# Patient Record
Sex: Female | Born: 1990 | Race: Black or African American | Hispanic: No | Marital: Single | Smoking: Current some day smoker
Health system: Southern US, Community
[De-identification: ages and names within clinical notes are randomized; demographics above are authoritative.]

## PROBLEM LIST (undated history)

## (undated) DIAGNOSIS — F1010 Alcohol abuse, uncomplicated: Secondary | ICD-10-CM

## (undated) DIAGNOSIS — J45909 Unspecified asthma, uncomplicated: Secondary | ICD-10-CM

## (undated) HISTORY — PX: TONSILLECTOMY: SUR1361

---
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
## 2011-01-13 LAB — DIFFERENTIAL
Eosinophils Absolute: 0.2
Eosinophils Relative: 2
Lymphocytes Relative: 23 — ABNORMAL LOW
Lymphs Abs: 2
Monocytes Absolute: 0.6
Monocytes Relative: 7

## 2011-01-13 LAB — POCT I-STAT, CHEM 8
BUN: 13
Calcium, Ion: 1.26
Chloride: 107
Glucose, Bld: 108 — ABNORMAL HIGH
HCT: 34 — ABNORMAL LOW
Potassium: 3.4 — ABNORMAL LOW

## 2011-01-13 LAB — CBC
HCT: 33.4 — ABNORMAL LOW
Hemoglobin: 11.6 — ABNORMAL LOW
MCV: 73.1 — ABNORMAL LOW
RBC: 4.57
WBC: 8.8

## 2012-08-10 ENCOUNTER — Emergency Department: Payer: Self-pay | Admitting: Emergency Medicine

## 2013-01-21 ENCOUNTER — Emergency Department: Payer: Self-pay | Admitting: Emergency Medicine

## 2013-01-21 LAB — COMPREHENSIVE METABOLIC PANEL
Alkaline Phosphatase: 82 U/L (ref 50–136)
Chloride: 106 mmol/L (ref 98–107)
Co2: 26 mmol/L (ref 21–32)
EGFR (Non-African Amer.): >60
Potassium: 3.4 mmol/L — ABNORMAL LOW (ref 3.5–5.1)
SGOT(AST): 23 U/L (ref 15–37)
Sodium: 138 mmol/L (ref 136–145)
Total Protein: 7.9 g/dL (ref 6.4–8.2)

## 2013-01-21 LAB — CBC
HGB: 12.8 g/dL (ref 12.0–16.0)
MCHC: 34.4 g/dL (ref 32.0–36.0)
RDW: 16 % — ABNORMAL HIGH (ref 11.5–14.5)
WBC: 7.1 10*3/uL (ref 3.6–11.0)

## 2013-01-21 LAB — URINALYSIS, COMPLETE
Ketone: NEGATIVE
Squamous Epithelial: 9
WBC UR: 43 /HPF (ref 0–5)

## 2013-01-21 LAB — PREGNANCY, URINE: Pregnancy Test, Urine: NEGATIVE m[IU]/mL

## 2013-01-23 LAB — URINE CULTURE

## 2013-04-29 ENCOUNTER — Emergency Department: Payer: Self-pay | Admitting: Emergency Medicine

## 2013-07-11 ENCOUNTER — Emergency Department: Payer: Self-pay | Admitting: Emergency Medicine

## 2014-10-25 ENCOUNTER — Ambulatory Visit: Payer: Self-pay

## 2014-11-12 ENCOUNTER — Ambulatory Visit
Admission: RE | Admit: 2014-11-12 | Discharge: 2014-11-12 | Disposition: A | Discharge status: Home / Self Care | Payer: Medicaid Other | Source: Ambulatory Visit | Attending: Obstetrics and Gynecology | Admitting: Obstetrics and Gynecology

## 2014-11-12 VITALS — BP 127/75 | HR 100 | Temp 98.0°F | Resp 18 | Ht 64.5 in | Wt 206.0 lb

## 2014-11-12 DIAGNOSIS — D582 Other hemoglobinopathies: Secondary | ICD-10-CM

## 2014-11-12 HISTORY — DX: Unspecified asthma, uncomplicated: J45.909

## 2014-11-12 NOTE — Progress Notes (Signed)
Referring Provider: [facility]  50 minute consultation  Jordan Stevens was referred to [facility] for genetic counseling to discuss her abnormal hemoglobin studies on prenatal labs.  She is currently [redacted] weeks gestation. This note summarizes the information we discussed.    We obtained a detailed family history and pregnancy history.  This is the first pregnancy for Jordan Stevens and her partner together.  Jordan Stevens has two children from prior relationships, ages 40 and 30, who are in good health.  The patient reported no complications thus far in this pregnancy.  She did report alcohol and cigarette use prior to [redacted] weeks gestation. She has stopped both of these exposures since that time.  The alcohol use was estimated to be every other weekend with several drinks on those occasions.  Alcohol consumption during pregnancy has been associated with a number of birth defects including growth delays, small head size, heart defects, eye anomalies and facial differences as well as learning disabilities and behavioral problems.  The risk of these to occur tends to increase with the amount of alcohol consumed, however, malformations have been seen with as little as two drinks per day.  Because there is no safe amount of alcohol consumption during pregnancy, we suggest patients completely avoid alcohol while they are pregnant.  A level 2 ultrasound and fetal echocardiogram (a detailed ultrasound of the fetal heart after 22 weeks) may help to detect growth delays or birth defects associated with alcohol use.  However, it is important to remember that not all birth defects can be identified prenatally and we cannot determine the extent of any developmental or behavioral differences.  Smoking is also known to be associated with low birth weight, preterm delivery and poor pregnancy outcomes. Jordan Stevens reported no family history of birth defects, developmental delays, recurrent  pregnancy loss or known genetic conditions.  There are reported relatives with hypertension, cardiovascular disease and diabetes, which we reviewed are likely to have both inherited and lifestyle factors.  Jordan Stevens and Jordan Stevens are both of African American ancestry and are not related to each other.  Jordan Stevens is noted to have had a quad screen performed through [facility], though we do not have a copy of those results for review.  If there is a problem with those results, we are happy to discuss them in the future.  The hemoglobin fractionation profile ordered by [facility] revealed an increased amount of hemoglobin F, or fetal hemoglobin.  Typically, an adult will have 94-98% Adult hemoglobin (A), and 0-2% Fetal hemoglobin (F) and 1-3% A2 hemoglobin.  Jordan Stevens was found to have 71.4% A, 26.7% F and 1.9% A2.  Also of note, her MCV and MCH were low, at 77 and 24.6, respectively.  An MCV on file from a couple of years ago was 72.  Elevated hemoglobin F can occur for various reasons.  This may be the result of hereditary persistence of fetal hemoglobin (HPFH), other inherited anemias, pregnancy, and rarely other health conditions.  This is higher than the amount typically expected during pregnancy alone, which is typically up to 5%.  Low MCV can also occur for multiple reasons such as iron deficiency, thalassemia trait (alpha or beta) and other hemoglobinopathies.  Iron studies were already performed on Jordan Stevens and were normal.  Most persons with HPFH have normal MCV.  There are a couple of possible explanations for her lab results.  These include: 1. HPFH 2. HPFH and alpha  thalassemia trait 3. Delta beta thalassemia trait  We reviewed that hemoglobin is a chemical within red blood cells that carries oxygen throughout the body.  Normal adult hemoglobin (A) is made up of 4 subunits, two alpha chains and two beta chains. Fetal hemoglobin (F) is made up of two alpha chains and two gamma  chains.  There can be many different variations in the way a body makes hemoglobin, which are most common among persons of African, Asian or Mediterranean ancestry.  Among the most common variants are sickle cell trait and hemoglobin C trait, which are the result of changes in the beta subunit.    We have two copies of all of our genetic instructions, one that is inherited from our mother and one from our father.  For the beta and gamma subunit, we have one gene from each parent.  For the alpha subunit of hemoglobin, there are two genes from each parent that tell our bodies what to make.  Therefore, we each have four total genes that direct the production of the alpha subunit. For a person to have alpha thalassemia trait, two of the four genes have a change so that they do not function properly.  This results in smaller than usual red blood cells.  No other medical problems are expected to occur.    In HPFH, a person has one copy of the gene for normal beta globin and one gene that makes gamma globin, thus resulting in both A and F hemoglobin being made by their body.  This is not harmful and is not expected to cause any health concerns.  In delta beta thalassemia trait, a person has one normal copy of the gene for beta globin and a gene that results in decreased amount of delta and beta chain production.  Other than a low MCV and hemoglobin F on hemoglobin studies, this trait is expected to be asyptomatic.  Regardless of which of these reasons is the cause for the elevated fetal hemoglobin and low MCV, we do not expect any health consequences for Jordan Stevens.  If she desires clarification of the reason, then additional lab studies can be offered to map the beta (and possibly alpha) globin genes.  The current question is how this may impact the health of the current pregnancy or any future children.    To determine the chance for this pregnancy to have a clinically significant hemoglobin condition, testing  of the father of the baby would be most helpful.  We would recommend a CBC and hemoglobin electrophoresis/fractionation on the father of the baby.  A review of Christopher's past CBC studies showed normal MCV (80-83), which significantly decreases the chance that he is a carrier for alpha or beta thalassemia trait.  We have no record of a hemoglobin electrophoresis on him. Jordan Stevens, dob 04/14/1985)  If Jordan Stevens has only HPFH, then we do not expect clinically significant hemoglobinopathy in the child, as this in combination with other hemoglobin changes usually results in a normal clinical outcome.  If she also has alpha thalassemia trait, then it is unlikely to be concerning for the baby because Jordan Stevens has a normal MCV and because in persons of African ancestry, the two non working copies of the gene are usually (>98% of cases) on different chromosomes, meaning one was passed on from each of their parents.  This also means that when a person passes this on to their child, they usually pass on one chromosome that has one  normal copy and one nonworking copy of the gene.  If both parents are carriers this way (called in trans), then at most the baby will inherited two normal and two nonworking copies and be carrier just like the parents.   However, if Jordan Stevens were to be a carrier for delta beta thalassemia, and Jordan Stevens were found to be carrier for beta thalassemia trait or sickle cell trait, then there would be a 1 in 4 chance for the baby to have either beta thalassemia intermedia or mild sickle cell disease. The only way to further assess this is to draw a hemoglobin study on Christopher followed by gene studies on Jordan Stevens if he was positive.  The patient stated that she plans to bring Jordan Stevens to the [facility] for testing. Please let our clinic know when this is performed and we are happy to follow up with the family on those results.  The patient was encouraged to call with  questions or concerns.  We can be contacted at [phone].  Cherly Anderson, MS, CGC

## 2014-11-12 NOTE — Progress Notes (Signed)
Jordan Wells, MS, CGC performed an integral service incident to the physician's initial service.  I was physically present in the clinical area and was immediately available to render assistance.   Ladon Heney C Naylee Frankowski  

## 2014-12-13 ENCOUNTER — Encounter: Payer: Self-pay | Admitting: Family Medicine

## 2014-12-13 ENCOUNTER — Ambulatory Visit (INDEPENDENT_AMBULATORY_CARE_PROVIDER_SITE_OTHER): Payer: Medicaid Other | Admitting: Family Medicine

## 2014-12-13 VITALS — BP 120/80 | HR 78 | Ht 64.0 in | Wt 207.0 lb

## 2014-12-13 DIAGNOSIS — J4520 Mild intermittent asthma, uncomplicated: Secondary | ICD-10-CM | POA: Diagnosis not present

## 2014-12-13 MED ORDER — ALBUTEROL SULFATE HFA 108 (90 BASE) MCG/ACT IN AERS: 2.0000 | INHALATION_SPRAY | Freq: Four times a day (QID) | RESPIRATORY_TRACT | Status: DC | PRN

## 2014-12-13 NOTE — Progress Notes (Signed)
Name: Jordan Stevens   MRN: [id number]    DOB: [birth year]   Date:12/13/2014       Progress Note  Subjective  Chief Complaint  Chief Complaint  Patient presents with  . Asthma    Asthma There is no chest tightness, cough, difficulty breathing, frequent throat clearing, hemoptysis, hoarse voice, shortness of breath, sputum production or wheezing. This is a recurrent problem. The current episode started more than 1 year ago. The problem occurs every several days. The problem has been gradually improving. Pertinent negatives include no appetite change, chest pain, dyspnea on exertion, ear congestion, ear pain, fever, headaches, heartburn, malaise/fatigue, myalgias, nasal congestion, orthopnea, PND, postnasal drip, rhinorrhea, sneezing, sore throat, sweats, trouble swallowing or weight loss. Her symptoms are aggravated by change in weather and pollen. Her symptoms are alleviated by beta-agonist. She reports moderate improvement on treatment. There are no known risk factors for lung disease. Her past medical history is significant for asthma.    No problem-specific assessment & plan notes found for this encounter.   Past Medical History  Diagnosis Date  . Asthma 24 years old    Past Surgical History  Procedure Laterality Date  . Leg surgery Left 2010  . Tonsillectomy      History reviewed. No pertinent family history.  Social History   Social History  . Marital Status: Single    Spouse Name: N/A  . Number of Children: N/A  . Years of Education: N/A   Occupational History  . Not on file.   Social History Main Topics  . Smoking status: Former Games developer  . Smokeless tobacco: Never Used  . Alcohol Use: No  . Drug Use: No  . Sexual Activity: Yes   Other Topics Concern  . Not on file   Social History Narrative    No Known Allergies   Review of Systems  Constitutional: Negative for fever, chills, weight loss, malaise/fatigue and appetite change.  HENT: Negative for ear  discharge, ear pain, hoarse voice, postnasal drip, rhinorrhea, sneezing, sore throat and trouble swallowing.   Eyes: Negative for blurred vision.  Respiratory: Negative for cough, hemoptysis, sputum production, shortness of breath and wheezing.   Cardiovascular: Negative for chest pain, dyspnea on exertion, palpitations, leg swelling and PND.  Gastrointestinal: Negative for heartburn, nausea, abdominal pain, diarrhea, constipation, blood in stool and melena.  Genitourinary: Negative for dysuria, urgency, frequency and hematuria.  Musculoskeletal: Negative for myalgias, back pain, joint pain and neck pain.  Skin: Negative for rash.  Neurological: Negative for dizziness, tingling, sensory change, focal weakness and headaches.  Endo/Heme/Allergies: Negative for environmental allergies and polydipsia. Does not bruise/bleed easily.  Psychiatric/Behavioral: Negative for depression and suicidal ideas. The patient is not nervous/anxious and does not have insomnia.      Objective  Filed Vitals:   12/13/14 0904  BP: 120/80  Pulse: 78  Height:  (1.626 m)  Weight: 207 lb (93.895 kg)    Physical Exam  Constitutional: She is well-developed, well-nourished, and in no distress. No distress.  HENT:  Head: Normocephalic and atraumatic.  Right Ear: External ear normal.  Left Ear: External ear normal.  Nose: Nose normal.  Mouth/Throat: Oropharynx is clear and moist.  Eyes: Conjunctivae and EOM are normal. Pupils are equal, round, and reactive to light. Right eye exhibits no discharge. Left eye exhibits no discharge.  Neck: Normal range of motion. Neck supple. No JVD present. No thyromegaly present.  Cardiovascular: Normal rate, regular rhythm, normal heart sounds and intact distal pulses.  Exam reveals no gallop and no friction rub.   No murmur heard. Pulmonary/Chest: Effort normal and breath sounds normal.  Abdominal: Soft. Bowel sounds are normal. She exhibits no mass. There is no tenderness.  There is no guarding.  Musculoskeletal: Normal range of motion. She exhibits no edema.  Lymphadenopathy:    She has no cervical adenopathy.  Neurological: She is alert. She has normal reflexes.  Skin: Skin is warm and dry. She is not diaphoretic.  Psychiatric: Mood and affect normal.      Assessment & Plan  Problem List Items Addressed This Visit    None    Visit Diagnoses    Asthma, mild intermittent, uncomplicated    -  Primary    Relevant Medications    albuterol (PROVENTIL HFA;VENTOLIN HFA) 108 (90 BASE) MCG/ACT inhaler         Dr. Hayden [facility]  12/13/2014

## 2015-04-16 ENCOUNTER — Encounter: Payer: Self-pay | Admitting: Family Medicine

## 2015-04-16 ENCOUNTER — Ambulatory Visit: Payer: Medicaid Other | Admitting: Family Medicine

## 2015-04-16 ENCOUNTER — Ambulatory Visit (INDEPENDENT_AMBULATORY_CARE_PROVIDER_SITE_OTHER): Payer: Medicaid Other | Admitting: Family Medicine

## 2015-04-16 VITALS — BP 120/70 | HR 80 | Ht 64.0 in | Wt 196.0 lb

## 2015-04-16 DIAGNOSIS — J0100 Acute maxillary sinusitis, unspecified: Secondary | ICD-10-CM | POA: Diagnosis not present

## 2015-04-16 DIAGNOSIS — J029 Acute pharyngitis, unspecified: Secondary | ICD-10-CM

## 2015-04-16 DIAGNOSIS — J4520 Mild intermittent asthma, uncomplicated: Secondary | ICD-10-CM | POA: Diagnosis not present

## 2015-04-16 MED ORDER — AZITHROMYCIN 250 MG PO TABS: ORAL_TABLET | ORAL | Status: DC

## 2015-04-16 MED ORDER — ALBUTEROL SULFATE HFA 108 (90 BASE) MCG/ACT IN AERS: 2.0000 | INHALATION_SPRAY | Freq: Four times a day (QID) | RESPIRATORY_TRACT | Status: DC | PRN

## 2015-04-16 NOTE — Progress Notes (Signed)
Name: Jordan DoeBria Stevens   MRN: [id number]    DOB: [birth year]   Date:04/16/2015       Progress Note  Subjective  Chief Complaint  Chief Complaint  Patient presents with  . URI    cough with yellow production- lost voice    URI  This is a new problem. The current episode started in the past 7 days. The problem has been waxing and waning. There has been no fever. Associated symptoms include congestion, coughing, ear pain, rhinorrhea, sinus pain, sneezing, a sore throat, swollen glands and wheezing. Pertinent negatives include no abdominal pain, chest pain, diarrhea, dysuria, headaches, joint pain, joint swelling, nausea, neck pain or rash. She has tried acetaminophen and inhaler use for the symptoms. The treatment provided mild relief.    No problem-specific assessment & plan notes found for this encounter.   Past Medical History  Diagnosis Date  . Asthma 24 years old    Past Surgical History  Procedure Laterality Date  . Leg surgery Left 2010  . Tonsillectomy      History reviewed. No pertinent family history.  Social History   Social History  . Marital Status: Single    Spouse Name: N/A  . Number of Children: N/A  . Years of Education: N/A   Occupational History  . Not on file.   Social History Main Topics  . Smoking status: Former Games developermoker  . Smokeless tobacco: Never Used  . Alcohol Use: No  . Drug Use: No  . Sexual Activity: Yes   Other Topics Concern  . Not on file   Social History Narrative    No Known Allergies   Review of Systems  Constitutional: Negative for fever, chills, weight loss and malaise/fatigue.  HENT: Positive for congestion, ear pain, rhinorrhea, sneezing and sore throat. Negative for ear discharge.   Eyes: Negative for blurred vision.  Respiratory: Positive for cough and wheezing. Negative for sputum production and shortness of breath.   Cardiovascular: Negative for chest pain, palpitations and leg swelling.  Gastrointestinal: Negative for  heartburn, nausea, abdominal pain, diarrhea, constipation, blood in stool and melena.  Genitourinary: Negative for dysuria, urgency, frequency and hematuria.  Musculoskeletal: Negative for myalgias, back pain, joint pain and neck pain.  Skin: Negative for rash.  Neurological: Negative for dizziness, tingling, sensory change, focal weakness and headaches.  Endo/Heme/Allergies: Negative for environmental allergies and polydipsia. Does not bruise/bleed easily.  Psychiatric/Behavioral: Negative for depression and suicidal ideas. The patient is not nervous/anxious and does not have insomnia.      Objective  Filed Vitals:   04/16/15 1529  BP: 120/70  Pulse: 80  Height: 5\' 4"  (1.626 m)  Weight: 196 lb (88.905 kg)    Physical Exam  Constitutional: She is well-developed, well-nourished, and in no distress. No distress.  HENT:  Head: Normocephalic and atraumatic.  Right Ear: External ear normal.  Left Ear: External ear normal.  Nose: Nose normal.  Mouth/Throat: Posterior oropharyngeal erythema present.  Eyes: Conjunctivae and EOM are normal. Pupils are equal, round, and reactive to light. Right eye exhibits no discharge. Left eye exhibits no discharge.  Neck: Normal range of motion. Neck supple. No JVD present. No thyromegaly present.  Cardiovascular: Normal rate, regular rhythm, normal heart sounds and intact distal pulses.  Exam reveals no gallop and no friction rub.   No murmur heard. Pulmonary/Chest: Effort normal and breath sounds normal.  Abdominal: Soft. Bowel sounds are normal. She exhibits no mass. There is no tenderness. There is no guarding.  Musculoskeletal:  Normal range of motion. She exhibits no edema.  Lymphadenopathy:    She has no cervical adenopathy.  Neurological: She is alert. She has normal reflexes.  Skin: Skin is warm and dry. She is not diaphoretic.  Psychiatric: Mood and affect normal.      Assessment & Plan  Problem List Items Addressed This Visit     None    Visit Diagnoses    Acute maxillary sinusitis, recurrence not specified    -  Primary    Relevant Medications    azithromycin (ZITHROMAX) 250 MG tablet    Pharyngitis        Relevant Medications    azithromycin (ZITHROMAX) 250 MG tablet    Asthma, mild intermittent, uncomplicated        Relevant Medications    albuterol (PROVENTIL HFA;VENTOLIN HFA) 108 (90 Base) MCG/ACT inhaler         Dr. Hayden [facility]  04/16/2015

## 2015-08-06 ENCOUNTER — Other Ambulatory Visit: Payer: Self-pay

## 2015-08-06 DIAGNOSIS — J309 Allergic rhinitis, unspecified: Secondary | ICD-10-CM

## 2015-08-06 MED ORDER — MONTELUKAST SODIUM 10 MG PO TABS: 10.0000 mg | ORAL_TABLET | Freq: Every day | ORAL | Status: DC

## 2016-01-22 ENCOUNTER — Other Ambulatory Visit: Payer: Self-pay | Admitting: Family Medicine

## 2016-01-22 DIAGNOSIS — J4520 Mild intermittent asthma, uncomplicated: Secondary | ICD-10-CM

## 2016-05-15 ENCOUNTER — Other Ambulatory Visit: Payer: Self-pay | Admitting: Family Medicine

## 2016-05-15 DIAGNOSIS — J4520 Mild intermittent asthma, uncomplicated: Secondary | ICD-10-CM

## 2016-05-21 ENCOUNTER — Ambulatory Visit (INDEPENDENT_AMBULATORY_CARE_PROVIDER_SITE_OTHER): Payer: Medicaid Other | Admitting: Family Medicine

## 2016-05-21 ENCOUNTER — Encounter: Payer: Self-pay | Admitting: Family Medicine

## 2016-05-21 VITALS — BP 120/80 | HR 64 | Ht 64.0 in | Wt 190.0 lb

## 2016-05-21 DIAGNOSIS — J4520 Mild intermittent asthma, uncomplicated: Secondary | ICD-10-CM

## 2016-05-21 MED ORDER — ALBUTEROL SULFATE HFA 108 (90 BASE) MCG/ACT IN AERS: 2.0000 | INHALATION_SPRAY | Freq: Four times a day (QID) | RESPIRATORY_TRACT | 11 refills | Status: DC | PRN

## 2016-05-21 MED ORDER — MONTELUKAST SODIUM 10 MG PO TABS: 10.0000 mg | ORAL_TABLET | Freq: Every day | ORAL | 11 refills | Status: DC

## 2016-05-21 NOTE — Progress Notes (Signed)
Name: Jordan Stevens   MRN: [id number]    DOB: 11-May-1990   Date:05/21/2016       Progress Note  Subjective  Chief Complaint  Chief Complaint  Patient presents with  . Asthma    Asthma  She complains of cough and wheezing. There is no chest tightness, difficulty breathing, frequent throat clearing, hemoptysis, hoarse voice, shortness of breath or sputum production. Primary symptoms comments: When have acute flair:. This is a chronic problem. The current episode started more than 1 year ago. The problem occurs intermittently. The problem has been waxing and waning. The cough is non-productive. Pertinent negatives include no chest pain, dyspnea on exertion, ear congestion, ear pain, fever, headaches, heartburn, malaise/fatigue, myalgias, nasal congestion, orthopnea, PND, postnasal drip, rhinorrhea, sore throat or weight loss. Her symptoms are aggravated by change in weather. Her symptoms are alleviated by beta-agonist. She reports moderate improvement on treatment. Her past medical history is significant for asthma. There is no history of bronchiectasis, bronchitis, COPD, emphysema or pneumonia.    No problem-specific Assessment & Plan notes found for this encounter.   Past Medical History:  Diagnosis Date  . Asthma 26 years old    Past Surgical History:  Procedure Laterality Date  . LEG SURGERY Left 2010  . TONSILLECTOMY      No family history on file.  Social History   Social History  . Marital status: Single    Spouse name: N/A  . Number of children: N/A  . Years of education: N/A   Occupational History  . Not on file.   Social History Main Topics  . Smoking status: Former Games developer  . Smokeless tobacco: Never Used  . Alcohol use No  . Drug use: No  . Sexual activity: Yes   Other Topics Concern  . Not on file   Social History Narrative  . No narrative on file    No Known Allergies   Review of Systems  Constitutional: Negative for chills, fever, malaise/fatigue and  weight loss.  HENT: Negative for ear discharge, ear pain, hoarse voice, postnasal drip, rhinorrhea and sore throat.   Eyes: Negative for blurred vision.  Respiratory: Positive for cough and wheezing. Negative for hemoptysis, sputum production and shortness of breath.   Cardiovascular: Negative for chest pain, dyspnea on exertion, palpitations, leg swelling and PND.  Gastrointestinal: Negative for abdominal pain, blood in stool, constipation, diarrhea, heartburn, melena and nausea.  Genitourinary: Negative for dysuria, frequency, hematuria and urgency.  Musculoskeletal: Negative for back pain, joint pain, myalgias and neck pain.  Skin: Negative for rash.  Neurological: Negative for dizziness, tingling, sensory change, focal weakness and headaches.  Endo/Heme/Allergies: Negative for environmental allergies and polydipsia. Does not bruise/bleed easily.  Psychiatric/Behavioral: Negative for depression and suicidal ideas. The patient is not nervous/anxious and does not have insomnia.      Objective  Vitals:   05/21/16 0839  BP: 120/80  Pulse: 64  Weight: 190 lb (86.2 kg)  Height: 5\' 4"  (1.626 m)    Physical Exam  Constitutional: She is well-developed, well-nourished, and in no distress. No distress.  HENT:  Head: Normocephalic and atraumatic.  Right Ear: External ear normal.  Left Ear: External ear normal.  Nose: Nose normal.  Mouth/Throat: Oropharynx is clear and moist.  Eyes: Conjunctivae and EOM are normal. Pupils are equal, round, and reactive to light. Right eye exhibits no discharge. Left eye exhibits no discharge.  Neck: Normal range of motion. Neck supple. No JVD present. No thyromegaly present.  Cardiovascular: Normal  rate, regular rhythm, normal heart sounds and intact distal pulses.  Exam reveals no gallop and no friction rub.   No murmur heard. Pulmonary/Chest: Effort normal and breath sounds normal. No respiratory distress. She has no wheezes. She has no rales.   Abdominal: Soft. Bowel sounds are normal. She exhibits no distension and no mass. There is no tenderness. There is no rebound and no guarding.  Musculoskeletal: Normal range of motion. She exhibits no edema.  Lymphadenopathy:    She has no cervical adenopathy.  Neurological: She is alert. She has normal reflexes.  Skin: Skin is warm and dry. She is not diaphoretic.  Psychiatric: Mood and affect normal.  Nursing note and vitals reviewed.     Assessment & Plan  Problem List Items Addressed This Visit      Respiratory   Mild intermittent asthma - Primary   Relevant Medications   montelukast (SINGULAIR) 10 MG tablet   albuterol (PROAIR HFA) 108 (90 Base) MCG/ACT inhaler        Dr. Hayden Rasmusseneanna Saloni [facility]abl[facility]  05/21/16

## 2016-08-17 ENCOUNTER — Other Ambulatory Visit: Payer: Self-pay

## 2016-08-17 DIAGNOSIS — J45909 Unspecified asthma, uncomplicated: Secondary | ICD-10-CM

## 2016-08-17 MED ORDER — BECLOMETHASONE DIPROPIONATE 40 MCG/ACT IN AERS: 1.0000 | INHALATION_SPRAY | Freq: Two times a day (BID) | RESPIRATORY_TRACT | 12 refills | Status: DC

## 2016-12-08 ENCOUNTER — Other Ambulatory Visit: Payer: Self-pay

## 2016-12-14 ENCOUNTER — Other Ambulatory Visit: Payer: Self-pay

## 2016-12-14 MED ORDER — BECLOMETHASONE DIPROP HFA 40 MCG/ACT IN AERB: 1.0000 | INHALATION_SPRAY | Freq: Two times a day (BID) | RESPIRATORY_TRACT | 2 refills | Status: DC

## 2016-12-14 NOTE — Telephone Encounter (Signed)
Rx Qvar Redihaler ordered in place of Qvar 40 mcg due to backordered

## 2016-12-15 ENCOUNTER — Telehealth: Payer: Self-pay

## 2016-12-15 NOTE — Telephone Encounter (Signed)
PA initiated for Qvar Redihaler 40 mcg  Ref # [id number] Authorization # [id number]

## 2017-04-02 ENCOUNTER — Ambulatory Visit: Payer: Self-pay | Admitting: Family Medicine

## 2017-04-09 ENCOUNTER — Ambulatory Visit (INDEPENDENT_AMBULATORY_CARE_PROVIDER_SITE_OTHER): Payer: Medicaid Other | Admitting: Family Medicine

## 2017-04-09 ENCOUNTER — Encounter: Payer: Self-pay | Admitting: Family Medicine

## 2017-04-09 VITALS — BP 100/64 | HR 70 | Ht 64.0 in | Wt 212.0 lb

## 2017-04-09 DIAGNOSIS — J4520 Mild intermittent asthma, uncomplicated: Secondary | ICD-10-CM

## 2017-04-09 DIAGNOSIS — Z23 Encounter for immunization: Secondary | ICD-10-CM | POA: Diagnosis not present

## 2017-04-09 MED ORDER — ALBUTEROL SULFATE (2.5 MG/3ML) 0.083% IN NEBU: 2.5000 mg | INHALATION_SOLUTION | Freq: Four times a day (QID) | RESPIRATORY_TRACT | 12 refills | Status: DC | PRN

## 2017-04-09 MED ORDER — ALBUTEROL SULFATE HFA 108 (90 BASE) MCG/ACT IN AERS: 2.0000 | INHALATION_SPRAY | Freq: Four times a day (QID) | RESPIRATORY_TRACT | 11 refills | Status: DC | PRN

## 2017-04-09 MED ORDER — BECLOMETHASONE DIPROP HFA 40 MCG/ACT IN AERB: 1.0000 | INHALATION_SPRAY | Freq: Two times a day (BID) | RESPIRATORY_TRACT | 11 refills | Status: DC

## 2017-04-09 MED ORDER — MONTELUKAST SODIUM 10 MG PO TABS: 10.0000 mg | ORAL_TABLET | Freq: Every day | ORAL | 11 refills | Status: DC

## 2017-04-09 NOTE — Progress Notes (Signed)
Name: Jordan Stevens   MRN: [id number]    DOB: 1990/07/21   Date:04/09/2017       Progress Note  Subjective  Chief Complaint  Chief Complaint  Patient presents with  . COPD    COPD  There is no chest tightness, cough, difficulty breathing, frequent throat clearing, hemoptysis, hoarse voice, shortness of breath, sputum production or wheezing. This is a new problem. The current episode started more than 1 year ago. The problem occurs every several days. The problem has been waxing and waning. Pertinent negatives include no appetite change, chest pain, dyspnea on exertion, ear congestion, ear pain, fever, headaches, heartburn, malaise/fatigue, myalgias, nasal congestion, orthopnea, PND, postnasal drip, rhinorrhea, sneezing, sore throat, sweats, trouble swallowing or weight loss. Her symptoms are aggravated by change in weather. Her symptoms are alleviated by beta-agonist and steroid inhaler. She reports moderate improvement on treatment. Her past medical history is significant for COPD. There is no history of asthma, bronchiectasis, bronchitis, emphysema or pneumonia.    No problem-specific Assessment & Plan notes found for this encounter.   Past Medical History:  Diagnosis Date  . Asthma 26 years old    Past Surgical History:  Procedure Laterality Date  . LEG SURGERY Left 2010  . TONSILLECTOMY      No family history on file.  Social History   Socioeconomic History  . Marital status: Single    Spouse name: Not on file  . Number of children: Not on file  . Years of education: Not on file  . Highest education level: Not on file  Social Needs  . Financial resource strain: Not on file  . Food insecurity - worry: Not on file  . Food insecurity - inability: Not on file  . Transportation needs - medical: Not on file  . Transportation needs - non-medical: Not on file  Occupational History  . Not on file  Tobacco Use  . Smoking status: Former Games developermoker  . Smokeless tobacco: Never Used   Substance and Sexual Activity  . Alcohol use: No  . Drug use: No  . Sexual activity: Yes  Other Topics Concern  . Not on file  Social History Narrative  . Not on file    No Known Allergies  Outpatient Medications Prior to Visit  Medication Sig Dispense Refill  . albuterol (PROAIR HFA) 108 (90 Base) MCG/ACT inhaler Inhale 2 puffs into the lungs every 6 (six) hours as needed for wheezing or shortness of breath. 18 Inhaler 11  . beclomethasone (QVAR REDIHALER) 40 MCG/ACT inhaler Inhale 1 puff into the lungs 2 (two) times daily. 1 Inhaler 2  . montelukast (SINGULAIR) 10 MG tablet Take 1 tablet (10 mg total) by mouth at bedtime. 30 tablet 11  . beclomethasone (QVAR) 40 MCG/ACT inhaler Inhale 1 puff into the lungs 2 (two) times daily. 1 Inhaler 12   No facility-administered medications prior to visit.     Review of Systems  Constitutional: Negative for appetite change, chills, fever, malaise/fatigue and weight loss.  HENT: Negative for ear discharge, ear pain, hoarse voice, postnasal drip, rhinorrhea, sneezing, sore throat and trouble swallowing.   Eyes: Negative for blurred vision.  Respiratory: Negative for cough, hemoptysis, sputum production, shortness of breath and wheezing.   Cardiovascular: Negative for chest pain, dyspnea on exertion, palpitations, leg swelling and PND.  Gastrointestinal: Negative for abdominal pain, blood in stool, constipation, diarrhea, heartburn, melena and nausea.  Genitourinary: Negative for dysuria, frequency, hematuria and urgency.  Musculoskeletal: Negative for back pain, joint pain,  myalgias and neck pain.  Skin: Negative for rash.  Neurological: Negative for dizziness, tingling, sensory change, focal weakness and headaches.  Endo/Heme/Allergies: Negative for environmental allergies and polydipsia. Does not bruise/bleed easily.  Psychiatric/Behavioral: Negative for depression and suicidal ideas. The patient is not nervous/anxious and does not have  insomnia.      Objective  Vitals:   04/09/17 1514  BP: 100/64  Pulse: 70  Weight: 212 lb (96.2 kg)  Height: 5\' 4"  (1.626 m)    Physical Exam  Constitutional: She is well-developed, well-nourished, and in no distress. No distress.  HENT:  Head: Normocephalic and atraumatic.  Right Ear: External ear normal.  Left Ear: External ear normal.  Nose: Nose normal.  Mouth/Throat: Oropharynx is clear and moist.  Eyes: Conjunctivae and EOM are normal. Pupils are equal, round, and reactive to light. Right eye exhibits no discharge. Left eye exhibits no discharge.  Neck: Normal range of motion. Neck supple. No JVD present. No thyromegaly present.  Cardiovascular: Normal rate, regular rhythm, normal heart sounds and intact distal pulses. Exam reveals no gallop and no friction rub.  No murmur heard. Pulmonary/Chest: Effort normal and breath sounds normal. She has no wheezes. She has no rales.  Abdominal: Soft. Bowel sounds are normal. She exhibits no mass. There is no tenderness. There is no guarding.  Musculoskeletal: Normal range of motion. She exhibits no edema.  Lymphadenopathy:    She has no cervical adenopathy.  Neurological: She is alert. She has normal reflexes.  Skin: Skin is warm and dry. She is not diaphoretic.  Psychiatric: Mood and affect normal.  Nursing note and vitals reviewed.     Assessment & Plan  Problem List Items Addressed This Visit      Respiratory   Mild intermittent asthma - Primary   Relevant Medications   montelukast (SINGULAIR) 10 MG tablet   albuterol (PROAIR HFA) 108 (90 Base) MCG/ACT inhaler   beclomethasone (QVAR REDIHALER) 40 MCG/ACT inhaler   albuterol (PROVENTIL) (2.5 MG/3ML) 0.083% nebulizer solution    Other Visit Diagnoses    Influenza vaccine needed       Relevant Orders   Flu Vaccine QUAD 36+ mos IM (Completed)      Meds ordered this encounter  Medications  . montelukast (SINGULAIR) 10 MG tablet    Sig: Take 1 tablet (10 mg total)  by mouth at bedtime.    Dispense:  30 tablet    Refill:  11  . albuterol (PROAIR HFA) 108 (90 Base) MCG/ACT inhaler    Sig: Inhale 2 puffs into the lungs every 6 (six) hours as needed for wheezing or shortness of breath.    Dispense:  18 Inhaler    Refill:  11  . beclomethasone (QVAR REDIHALER) 40 MCG/ACT inhaler    Sig: Inhale 1 puff into the lungs 2 (two) times daily.    Dispense:  1 Inhaler    Refill:  11  . albuterol (PROVENTIL) (2.5 MG/3ML) 0.083% nebulizer solution    Sig: Take 3 mLs (2.5 mg total) by nebulization every 6 (six) hours as needed for wheezing or shortness of breath.    Dispense:  75 mL    Refill:  12      Dr. Hayden Rasmusseneanna Genavie Boettger [facility]  04/09/17

## 2017-04-15 ENCOUNTER — Other Ambulatory Visit: Payer: Self-pay

## 2018-03-27 ENCOUNTER — Other Ambulatory Visit: Payer: Self-pay | Admitting: Family Medicine

## 2018-03-27 DIAGNOSIS — J4520 Mild intermittent asthma, uncomplicated: Secondary | ICD-10-CM

## 2018-04-08 ENCOUNTER — Other Ambulatory Visit: Payer: Self-pay

## 2018-04-08 ENCOUNTER — Ambulatory Visit (INDEPENDENT_AMBULATORY_CARE_PROVIDER_SITE_OTHER): Payer: 59 | Admitting: Family Medicine

## 2018-04-08 ENCOUNTER — Encounter: Payer: Self-pay | Admitting: Family Medicine

## 2018-04-08 VITALS — BP 120/62 | HR 70 | Ht 64.0 in | Wt 213.0 lb

## 2018-04-08 DIAGNOSIS — Z23 Encounter for immunization: Secondary | ICD-10-CM | POA: Diagnosis not present

## 2018-04-08 DIAGNOSIS — J4520 Mild intermittent asthma, uncomplicated: Secondary | ICD-10-CM

## 2018-04-08 DIAGNOSIS — M6740 Ganglion, unspecified site: Secondary | ICD-10-CM

## 2018-04-08 MED ORDER — ALBUTEROL SULFATE HFA 108 (90 BASE) MCG/ACT IN AERS
2.0000 | INHALATION_SPRAY | Freq: Four times a day (QID) | RESPIRATORY_TRACT | 11 refills | Status: DC | PRN
Start: 2018-04-08 — End: 2018-04-08

## 2018-04-08 MED ORDER — ALBUTEROL SULFATE HFA 108 (90 BASE) MCG/ACT IN AERS: 2.0000 | INHALATION_SPRAY | Freq: Four times a day (QID) | RESPIRATORY_TRACT | 11 refills | Status: DC | PRN

## 2018-04-08 MED ORDER — MONTELUKAST SODIUM 10 MG PO TABS: 10.0000 mg | ORAL_TABLET | Freq: Every day | ORAL | 11 refills | Status: DC

## 2018-04-08 NOTE — Progress Notes (Signed)
Date:  04/08/2018   Name:  Jordan Stevens   DOB:  07/23/1990   MRN:  [id number]   Chief Complaint: Asthma and Flu Vaccine  Asthma  She complains of wheezing. There is no chest tightness, cough, difficulty breathing, frequent throat clearing, hemoptysis, hoarse voice, shortness of breath or sputum production. Primary symptoms comments: For maintenance/control. This is a chronic problem. The current episode started more than 1 year ago. The problem occurs intermittently. The problem has been waxing and waning. Pertinent negatives include no appetite change, chest pain, dyspnea on exertion, ear congestion, ear pain, fever, headaches, heartburn, malaise/fatigue, myalgias, nasal congestion, orthopnea, PND, postnasal drip, rhinorrhea, sneezing, sore throat, sweats, trouble swallowing or weight loss. Her symptoms are aggravated by change in weather and pollen. Her symptoms are alleviated by nothing. Her past medical history is significant for asthma.    Review of Systems  Constitutional: Negative.  Negative for appetite change, chills, fatigue, fever, malaise/fatigue, unexpected weight change and weight loss.  HENT: Negative for congestion, ear discharge, ear pain, hoarse voice, postnasal drip, rhinorrhea, sinus pressure, sneezing, sore throat and trouble swallowing.   Eyes: Negative for photophobia, pain, discharge, redness and itching.  Respiratory: Positive for wheezing. Negative for cough, hemoptysis, sputum production, shortness of breath and stridor.   Cardiovascular: Negative for chest pain, dyspnea on exertion and PND.  Gastrointestinal: Negative for abdominal pain, blood in stool, constipation, diarrhea, heartburn, nausea and vomiting.  Endocrine: Negative for cold intolerance, heat intolerance, polydipsia, polyphagia and polyuria.  Genitourinary: Negative for dysuria, flank pain, frequency, hematuria, menstrual problem, pelvic pain, urgency, vaginal bleeding and vaginal discharge.    Musculoskeletal: Negative for arthralgias, back pain and myalgias.  Skin: Negative for rash.  Allergic/Immunologic: Negative for environmental allergies and food allergies.  Neurological: Negative for dizziness, weakness, light-headedness, numbness and headaches.  Hematological: Negative for adenopathy. Does not bruise/bleed easily.  Psychiatric/Behavioral: Negative for dysphoric mood. The patient is not nervous/anxious.     Patient Active Problem List   Diagnosis Date Noted  . Mild intermittent asthma 05/21/2016  . Abnormal hemoglobin (HCC) 11/12/2014    No Known Allergies  Past Surgical History:  Procedure Laterality Date  . LEG SURGERY Left 2010  . TONSILLECTOMY      Social History   Tobacco Use  . Smoking status: Former Games developermoker  . Smokeless tobacco: Never Used  Substance Use Topics  . Alcohol use: No  . Drug use: No     Medication list has been reviewed and updated.  Current Meds  Medication Sig  . albuterol (PROAIR HFA) 108 (90 Base) MCG/ACT inhaler Inhale 2 puffs into the lungs every 6 (six) hours as needed for wheezing or shortness of breath.  Marland Kitchen. albuterol (PROVENTIL) (2.5 MG/3ML) 0.083% nebulizer solution Take 3 mLs (2.5 mg total) by nebulization every 6 (six) hours as needed for wheezing or shortness of breath.  . [DISCONTINUED] albuterol (PROAIR HFA) 108 (90 Base) MCG/ACT inhaler Inhale 2 puffs into the lungs every 6 (six) hours as needed for wheezing or shortness of breath.  . [DISCONTINUED] albuterol (PROAIR HFA) 108 (90 Base) MCG/ACT inhaler Inhale 2 puffs into the lungs every 6 (six) hours as needed for wheezing or shortness of breath.    No flowsheet data found.  Physical Exam Vitals signs and nursing note reviewed.  Constitutional:      General: She is not in acute distress.    Appearance: She is not diaphoretic.  HENT:     Head: Normocephalic and atraumatic.  Right Ear: External ear normal.     Left Ear: External ear normal.     Nose: Nose  normal.  Eyes:     General:        Right eye: No discharge.        Left eye: No discharge.     Conjunctiva/sclera: Conjunctivae normal.     Pupils: Pupils are equal, round, and reactive to light.  Neck:     Musculoskeletal: Normal range of motion and neck supple.     Thyroid: No thyromegaly.     Vascular: No JVD.  Cardiovascular:     Rate and Rhythm: Normal rate and regular rhythm.     Heart sounds: Normal heart sounds. No murmur. No friction rub. No gallop.   Pulmonary:     Effort: Pulmonary effort is normal.     Breath sounds: Normal breath sounds.  Abdominal:     General: Bowel sounds are normal.     Palpations: Abdomen is soft. There is no mass.     Tenderness: There is no abdominal tenderness. There is no guarding.  Musculoskeletal: Normal range of motion.     Left wrist: She exhibits deformity.     Comments: Ganglion cyst left dorsum wrist.  Lymphadenopathy:     Cervical: No cervical adenopathy.  Skin:    General: Skin is warm and dry.  Neurological:     Mental Status: She is alert.     Deep Tendon Reflexes: Reflexes are normal and symmetric.     BP 120/62   Pulse 70   Ht 5\' 4"  (1.626 m)   Wt 213 lb (96.6 kg)   LMP 03/20/2018 (Approximate)   Breastfeeding No   BMI 36.56 kg/m   Assessment and Plan:  1. Mild intermittent asthma without complication Episodic.  Mild intermittent.  Currently stable.  Refill albuterol inhaler 2 puffs every 6 hours as needed wheezing and maintenance Singulair 10 mg once a day. - albuterol (PROAIR HFA) 108 (90 Base) MCG/ACT inhaler; Inhale 2 puffs into the lungs every 6 (six) hours as needed for wheezing or shortness of breath.  Dispense: 18 Inhaler; Refill: 11 - montelukast (SINGULAIR) 10 MG tablet; Take 1 tablet (10 mg total) by mouth at bedtime.  Dispense: 30 tablet; Refill: 11  2. Ganglion cyst New problem.  Current ganglion cyst of the left wrist dorsal aspect.  Will refer to orthopedic surgery for evaluation and treatment. -  Ambulatory referral to Orthopedic Surgery  3. Influenza vaccine needed Discussed and administered. - Flu Vaccine QUAD 36+ mos IM

## 2018-05-03 ENCOUNTER — Other Ambulatory Visit: Payer: Self-pay | Admitting: Family Medicine

## 2018-05-03 DIAGNOSIS — J4520 Mild intermittent asthma, uncomplicated: Secondary | ICD-10-CM

## 2018-11-01 ENCOUNTER — Encounter: Payer: Self-pay | Admitting: Family

## 2018-11-01 ENCOUNTER — Telehealth: Payer: 59 | Admitting: Family

## 2018-11-01 DIAGNOSIS — R6889 Other general symptoms and signs: Secondary | ICD-10-CM | POA: Diagnosis not present

## 2018-11-01 DIAGNOSIS — Z20822 Contact with and (suspected) exposure to covid-19: Secondary | ICD-10-CM

## 2018-11-01 NOTE — Progress Notes (Signed)
E-Visit for Corona Virus Screening    Your current symptoms could be consistent with the coronavirus.  Call your health care provider or local health department to request and arrange formal testing. Many health care providers can now test patients at their office but not all are.  Please quarantine yourself while awaiting your test results.  [facility] [phone], [place], [place] or visit [email or web address]   I WILL HAVE SOMEONE CALL AND SCHEDULE TESTING FOR YOU  COVID-19 is a respiratory illness with symptoms that are similar to the flu. Symptoms are typically mild to moderate, but there have been cases of severe illness and death due to the virus. The following symptoms may appear 2-14 days after exposure: . Fever . Cough . Shortness of breath or difficulty breathing . Chills . Repeated shaking with chills . Muscle pain . Headache . Sore throat . New loss of taste or smell . Fatigue . Congestion or runny nose . Nausea or vomiting . Diarrhea  It is vitally important that if you feel that you have an infection such as this virus or any other virus that you stay home and away from places where you may spread it to others.  You should self-quarantine for 14 days if you have symptoms that could potentially be coronavirus or have been in close contact a with a person diagnosed with COVID-19 within the last 2 weeks. You should avoid contact with people age 51 and older.   You should wear a mask or cloth face covering over your nose and mouth if you must be around other people or animals, including pets (even at home). Try to stay at least 6 feet away from other people. This will protect the people around you.  You may also take acetaminophen (Tylenol) as needed for fever.   Reduce your risk of any infection by using the same  precautions used for avoiding the common cold or flu:  Marland Kitchen Wash your hands often with soap and warm water for at least 20 seconds.  If soap and water are not readily available, use an alcohol-based hand sanitizer with at least 60% alcohol.  . If coughing or sneezing, cover your mouth and nose by coughing or sneezing into the elbow areas of your shirt or coat, into a tissue or into your sleeve (not your hands). . Avoid shaking hands with others and consider head nods or verbal greetings only. . Avoid touching your eyes, nose, or mouth with unwashed hands.  . Avoid close contact with people who are sick. . Avoid places or events with large numbers of people in one location, like concerts or sporting events. . Carefully consider travel plans you have or are making. . If you are planning any travel outside or inside the [place], visit the CDC's Travelers' Health webpage for the latest health notices. . If you have some symptoms but not all symptoms, continue to monitor at home and seek medical attention if your symptoms worsen. . If you are having a medical emergency, call 911.  HOME CARE . Only take medications as instructed by your medical team. . Drink plenty of fluids and get plenty of rest. . A steam or ultrasonic humidifier can help if you have congestion.   GET HELP RIGHT AWAY IF YOU HAVE EMERGENCY WARNING SIGNS** FOR COVID-19. If you or someone is showing any of these signs seek emergency medical care immediately. Call 911 or proceed to your closest emergency facility  if: . You develop worsening high fever. . Trouble breathing . Bluish lips or face . Persistent pain or pressure in the chest . New confusion . Inability to wake or stay awake . You cough up blood. . Your symptoms become more severe  **This list is not all possible symptoms. Contact your medical provider for any symptoms that are sever or concerning to you.   MAKE SURE YOU   Understand these instructions.  Will watch your  condition.  Will get help right away if you are not doing well or get worse.  Your e-visit answers were reviewed by a board certified advanced clinical practitioner to complete your personal care plan.  Depending on the condition, your plan could have included both over the counter or prescription medications.  If there is a problem please reply once you have received a response from your provider.  Your safety is important to us.  If you have drug allergies check your prescription carefully.    You can use MyChart to ask questions about today's visit, request a non-urgent call back, or ask for a work or school excuse for 24 hours related to this e-Visit. If it has been greater than 24 hours you will need to follow up with your provider, or enter a new e-Visit to address those concerns. You will get an e-mail in the next two days asking about your experience.  I hope that your e-visit has been valuable and will speed your recovery. Thank you for using e-visits.   Greater than 5 minutes, yet less than 10 minutes of time have been spent researching, coordinating, and implementing care for this patient today.  Thank you for the details you included in the comment boxes. Those details are very helpful in determining the best course of treatment for you and help us to provide the best care.

## 2018-12-07 ENCOUNTER — Telehealth: Payer: 59 | Admitting: Nurse Practitioner

## 2018-12-07 DIAGNOSIS — Z20828 Contact with and (suspected) exposure to other viral communicable diseases: Secondary | ICD-10-CM

## 2018-12-07 DIAGNOSIS — Z20822 Contact with and (suspected) exposure to covid-19: Secondary | ICD-10-CM

## 2018-12-07 MED ORDER — BENZONATATE 100 MG PO CAPS: 100.0000 mg | ORAL_CAPSULE | Freq: Three times a day (TID) | ORAL | 0 refills | Status: DC | PRN

## 2018-12-07 NOTE — Progress Notes (Signed)
E-Visit for Corona Virus Screening   Your current symptoms could be consistent with the coronavirus.  Many health care providers can now test patients at their office but not all are.  [facility] has multiple testing sites. For information on our COVID testing locations and hours go to [email or web address]  Please quarantine yourself while awaiting your test results.  We are enrolling you in our MyChart Home Montioring for COVID19 . Daily you will receive a questionnaire within the MyChart website. Our COVID 19 response team willl be monitoriing your responses daily.  You can go to one of the  testing sites listed below, while they are opened (see hours). You do not need a doctors order to be tested for covid.You do need to self-isolate until your results return and if positive 14 days from when your symptoms started and until you are 3 days symptom free.   Testing Locations (Monday - Friday, 8 a.m. - 3:30 p.m.) . [place]: [facility], [address], [place], [place]  . [place]: [facility], [address], [place], [place] (entrance off [address])  . [place]: [address], [place], [place] (across from [facility])    COVID-19 is a respiratory illness with symptoms that are similar to the flu. Symptoms are typically mild to moderate, but there have been cases of severe illness and death due to the virus. The following symptoms may appear 2-14 days after exposure: . Fever . Cough . Shortness of breath or difficulty breathing . Chills . Repeated shaking with chills . Muscle pain . Headache . Sore throat . New loss of taste or smell . Fatigue . Congestion or runny nose . Nausea or vomiting . Diarrhea  It is vitally important that if you feel that you have an infection such as this virus or any other virus that you stay home and away from places where you may  spread it to others.  You should self-quarantine for 14 days if you have symptoms that could potentially be coronavirus or have been in close contact a with a person diagnosed with COVID-19 within the last 2 weeks. You should avoid contact with people age 65 and older.   You should wear a mask or cloth face covering over your nose and mouth if you must be around other people or animals, including pets (even at home). Try to stay at least 6 feet away from other people. This will protect the people around you.  You can use medication such as A prescription cough medication called Tessalon Perles 100 mg. You may take 1-2 capsules every 8 hours as needed for cough  You may also take acetaminophen (Tylenol) as needed for fever.   Reduce your risk of any infection by using the same precautions used for avoiding the common cold or flu:  . Wash your hands often with soap and warm water for at least 20 seconds.  If soap and water are not readily available, use an alcohol-based hand sanitizer with at least 60% alcohol.  . If coughing or sneezing, cover your mouth and nose by coughing or sneezing into the elbow areas of your shirt or coat, into a tissue or into your sleeve (not your hands). . Avoid shaking hands with others and consider head nods or verbal greetings only. . Avoid touching your eyes, nose, or mouth with unwashed hands.  . Avoid close contact with people who are sick. . Avoid places or   events with large numbers of people in one location, like concerts or sporting events. . Carefully consider travel plans you have or are making. . If you are planning any travel outside or inside the [place], visit the CDC's Travelers' Health webpage for the latest health notices. . If you have some symptoms but not all symptoms, continue to monitor at home and seek medical attention if your symptoms worsen. . If you are having a medical emergency, call 911.  HOME CARE . Only take medications as instructed by your  medical team. . Drink plenty of fluids and get plenty of rest. . A steam or ultrasonic humidifier can help if you have congestion.   GET HELP RIGHT AWAY IF YOU HAVE EMERGENCY WARNING SIGNS** FOR COVID-19. If you or someone is showing any of these signs seek emergency medical care immediately. Call 911 or proceed to your closest emergency facility if: . You develop worsening high fever. . Trouble breathing . Bluish lips or face . Persistent pain or pressure in the chest . New confusion . Inability to wake or stay awake . You cough up blood. . Your symptoms become more severe  **This list is not all possible symptoms. Contact your medical provider for any symptoms that are sever or concerning to you.   MAKE SURE YOU   Understand these instructions.  Will watch your condition.  Will get help right away if you are not doing well or get worse.  Your e-visit answers were reviewed by a board certified advanced clinical practitioner to complete your personal care plan.  Depending on the condition, your plan could have included both over the counter or prescription medications.  If there is a problem please reply once you have received a response from your provider.  Your safety is important to us.  If you have drug allergies check your prescription carefully.    You can use MyChart to ask questions about today's visit, request a non-urgent call back, or ask for a work or school excuse for 24 hours related to this e-Visit. If it has been greater than 24 hours you will need to follow up with your provider, or enter a new e-Visit to address those concerns. You will get an e-mail in the next two days asking about your experience.  I hope that your e-visit has been valuable and will speed your recovery. Thank you for using e-visits.   5-10 minutes spent reviewing and documenting in chart.  

## 2018-12-21 ENCOUNTER — Ambulatory Visit: Payer: 59 | Admitting: Family Medicine

## 2018-12-21 ENCOUNTER — Encounter: Payer: Self-pay | Admitting: Family Medicine

## 2018-12-21 ENCOUNTER — Other Ambulatory Visit: Payer: Self-pay

## 2018-12-21 VITALS — BP 122/82 | HR 84 | Resp 16 | Ht 64.0 in | Wt 227.0 lb

## 2018-12-21 DIAGNOSIS — J4521 Mild intermittent asthma with (acute) exacerbation: Secondary | ICD-10-CM

## 2018-12-21 DIAGNOSIS — J45909 Unspecified asthma, uncomplicated: Secondary | ICD-10-CM

## 2018-12-21 MED ORDER — AZITHROMYCIN 250 MG PO TABS: ORAL_TABLET | ORAL | 0 refills | Status: DC

## 2018-12-21 MED ORDER — GUAIFENESIN-CODEINE 100-10 MG/5ML PO SYRP: 5.0000 mL | ORAL_SOLUTION | Freq: Four times a day (QID) | ORAL | 0 refills | Status: DC | PRN

## 2018-12-21 MED ORDER — PREDNISONE 10 MG PO TABS: 10.0000 mg | ORAL_TABLET | Freq: Every day | ORAL | 0 refills | Status: DC

## 2018-12-21 NOTE — Progress Notes (Signed)
Date:  12/21/2018   Name:  Jordan Stevens   DOB:  04/14/1991   MRN:  [id number]   Chief Complaint: Cough (2 weeks of cough)  Cough This is a new problem. The current episode started 1 to 4 weeks ago (2 weeks). The problem has been waxing and waning. The problem occurs constantly. The cough is productive of purulent sputum (yellow to green). Associated symptoms include a rash, rhinorrhea, shortness of breath and wheezing. Pertinent negatives include no chest pain, chills, ear congestion, ear pain, eye redness, fever, headaches, heartburn, hemoptysis, myalgias, nasal congestion, postnasal drip, sore throat, sweats or weight loss. The symptoms are aggravated by pollens. She has tried a beta-agonist inhaler (tessalon perles) for the symptoms. There is no history of environmental allergies.    Review of Systems  Constitutional: Negative.  Negative for chills, fatigue, fever, unexpected weight change and weight loss.  HENT: Positive for rhinorrhea. Negative for congestion, ear discharge, ear pain, postnasal drip, sinus pressure, sneezing and sore throat.   Eyes: Negative for photophobia, pain, discharge, redness and itching.  Respiratory: Positive for cough, shortness of breath and wheezing. Negative for hemoptysis and stridor.   Cardiovascular: Negative for chest pain.  Gastrointestinal: Negative for abdominal pain, blood in stool, constipation, diarrhea, heartburn, nausea and vomiting.  Endocrine: Negative for cold intolerance, heat intolerance, polydipsia, polyphagia and polyuria.  Genitourinary: Negative for dysuria, flank pain, frequency, hematuria, menstrual problem, pelvic pain, urgency, vaginal bleeding and vaginal discharge.  Musculoskeletal: Negative for arthralgias, back pain and myalgias.  Skin: Positive for rash.  Allergic/Immunologic: Negative for environmental allergies and food allergies.  Neurological: Negative for dizziness, weakness, light-headedness, numbness and headaches.   Hematological: Negative for adenopathy. Does not bruise/bleed easily.  Psychiatric/Behavioral: Negative for dysphoric mood. The patient is not nervous/anxious.     Patient Active Problem List   Diagnosis Date Noted  . Mild intermittent asthma 05/21/2016  . Abnormal hemoglobin (HCC) 11/12/2014    No Known Allergies  Past Surgical History:  Procedure Laterality Date  . LEG SURGERY Left 2010  . TONSILLECTOMY      Social History   Tobacco Use  . Smoking status: Former Games developermoker  . Smokeless tobacco: Never Used  Substance Use Topics  . Alcohol use: No  . Drug use: No     Medication list has been reviewed and updated.  Current Meds  Medication Sig  . albuterol (PROVENTIL HFA) 108 (90 Base) MCG/ACT inhaler Inhale into the lungs every 6 (six) hours as needed for wheezing or shortness of breath.  Marland Kitchen. albuterol (PROVENTIL) (2.5 MG/3ML) 0.083% nebulizer solution TAKE 3 MLS BY NEBULIZATION EVERY 6 HOURS AS NEEDED FOR WHEEZING OR SHORTNESS OF BREATH.  . [DISCONTINUED] albuterol (PROAIR HFA) 108 (90 Base) MCG/ACT inhaler Inhale 2 puffs into the lungs every 6 (six) hours as needed for wheezing or shortness of breath.  . [DISCONTINUED] montelukast (SINGULAIR) 10 MG tablet Take 1 tablet (10 mg total) by mouth at bedtime.    PHQ 2/9 Scores 12/21/2018  PHQ - 2 Score 0    BP Readings from Last 3 Encounters:  12/21/18 122/82  04/08/18 120/62  04/09/17 100/64    Physical Exam Vitals signs and nursing note reviewed.  Constitutional:      General: She is not in acute distress.    Appearance: She is obese. She is not diaphoretic.  HENT:     Head: Normocephalic and atraumatic.     Right Ear: Tympanic membrane, ear canal and external ear normal.  Left Ear: Tympanic membrane, ear canal and external ear normal.     Nose: Nose normal.     Mouth/Throat:     Mouth: Mucous membranes are moist.  Eyes:     General:        Right eye: No discharge.        Left eye: No discharge.      Conjunctiva/sclera: Conjunctivae normal.     Pupils: Pupils are equal, round, and reactive to light.  Neck:     Musculoskeletal: Normal range of motion and neck supple. No muscular tenderness.     Thyroid: No thyromegaly.     Vascular: No carotid bruit or JVD.  Cardiovascular:     Rate and Rhythm: Normal rate and regular rhythm.     Heart sounds: Normal heart sounds. No murmur. No friction rub. No gallop.   Pulmonary:     Effort: Pulmonary effort is normal.     Breath sounds: Wheezing present. No rhonchi or rales.  Abdominal:     General: Bowel sounds are normal.     Palpations: Abdomen is soft. There is no mass.     Tenderness: There is no abdominal tenderness. There is no guarding.  Musculoskeletal: Normal range of motion.  Lymphadenopathy:     Cervical: No cervical adenopathy.  Skin:    General: Skin is warm and dry.     Capillary Refill: Capillary refill takes less than 2 seconds.  Neurological:     General: No focal deficit present.     Mental Status: She is alert.     Deep Tendon Reflexes: Reflexes are normal and symmetric.     Wt Readings from Last 3 Encounters:  12/21/18 227 lb (103 kg)  04/08/18 213 lb (96.6 kg)  04/09/17 212 lb (96.2 kg)    BP 122/82   Pulse 84   Resp 16   Ht 5\' 4"  (1.626 m)   Wt 227 lb (103 kg)   LMP 12/14/2018   SpO2 98%   BMI 38.96 kg/m   Assessment and Plan:  1. Mild intermittent asthma with acute exacerbation in adult Patient with history of mild intermittent asthma but there is an acute exacerbation that is been continuing for over 2 weeks.  Patient has been using rescue inhaler on a every 6 hours basis patient was given a sample of Symbicort twice a day to use with rescue inhaler. - albuterol (PROVENTIL HFA) 108 (90 Base) MCG/ACT inhaler; Inhale into the lungs every 6 (six) hours as needed for wheezing or shortness of breath. - predniSONE (DELTASONE) 10 MG tablet; Take 1 tablet (10 mg total) by mouth daily with breakfast.  Dispense:  30 tablet; Refill: 0  2. Asthma with bronchitis Patient has had a purulent cough with her asthma for over 2 weeks therefore we will initiate antibiotic treatment plan.  Patient was also given prednisone 10 mg once a day and azithromycin 2 today followed by 1 a day for 4 days.  Patient was given Robitussin-AC 1 teaspoon every 6 hours as needed cough - albuterol (PROVENTIL HFA) 108 (90 Base) MCG/ACT inhaler; Inhale into the lungs every 6 (six) hours as needed for wheezing or shortness of breath. - predniSONE (DELTASONE) 10 MG tablet; Take 1 tablet (10 mg total) by mouth daily with breakfast.  Dispense: 30 tablet; Refill: 0 - guaiFENesin-codeine (ROBITUSSIN AC) 100-10 MG/5ML syrup; Take 5 mLs by mouth 4 (four) times daily as needed for cough.  Dispense: 118 mL; Refill: 0 - azithromycin (ZITHROMAX) 250 MG tablet;  2 today then 1 a day for 4 days  Dispense: 6 tablet; Refill: 0

## 2019-01-13 ENCOUNTER — Other Ambulatory Visit: Payer: Self-pay | Admitting: Family Medicine

## 2019-01-13 DIAGNOSIS — J45909 Unspecified asthma, uncomplicated: Secondary | ICD-10-CM

## 2019-01-13 DIAGNOSIS — J4521 Mild intermittent asthma with (acute) exacerbation: Secondary | ICD-10-CM

## 2019-05-11 ENCOUNTER — Other Ambulatory Visit: Payer: Self-pay | Admitting: Family Medicine

## 2019-05-11 DIAGNOSIS — J45909 Unspecified asthma, uncomplicated: Secondary | ICD-10-CM

## 2019-05-11 DIAGNOSIS — J4521 Mild intermittent asthma with (acute) exacerbation: Secondary | ICD-10-CM

## 2019-06-06 ENCOUNTER — Ambulatory Visit: Payer: 59

## 2019-08-03 ENCOUNTER — Ambulatory Visit: Payer: 59 | Admitting: Family Medicine

## 2019-08-03 ENCOUNTER — Encounter: Payer: Self-pay | Admitting: Family Medicine

## 2019-08-03 ENCOUNTER — Other Ambulatory Visit: Payer: Self-pay

## 2019-08-03 DIAGNOSIS — J4520 Mild intermittent asthma, uncomplicated: Secondary | ICD-10-CM | POA: Diagnosis not present

## 2019-08-03 DIAGNOSIS — J4521 Mild intermittent asthma with (acute) exacerbation: Secondary | ICD-10-CM | POA: Diagnosis not present

## 2019-08-03 DIAGNOSIS — J45909 Unspecified asthma, uncomplicated: Secondary | ICD-10-CM | POA: Diagnosis not present

## 2019-08-03 MED ORDER — MONTELUKAST SODIUM 10 MG PO TABS: 10.0000 mg | ORAL_TABLET | Freq: Every day | ORAL | 3 refills | Status: DC

## 2019-08-03 MED ORDER — ALBUTEROL SULFATE (2.5 MG/3ML) 0.083% IN NEBU: INHALATION_SOLUTION | RESPIRATORY_TRACT | 11 refills | Status: AC

## 2019-08-03 MED ORDER — ALBUTEROL SULFATE HFA 108 (90 BASE) MCG/ACT IN AERS: INHALATION_SPRAY | RESPIRATORY_TRACT | 1 refills | Status: DC

## 2019-08-03 NOTE — Progress Notes (Signed)
Date:  08/03/2019   Name:  Jordan Stevens   DOB:  [birth year]   MRN:  [id number]   Chief Complaint: Asthma (med refill) and Allergies  Asthma There is no chest tightness, cough, difficulty breathing, frequent throat clearing, hemoptysis, hoarse voice, shortness of breath, sputum production or wheezing. This is a chronic problem. The current episode started more than 1 year ago. The problem occurs intermittently. The problem has been gradually improving. Pertinent negatives include no appetite change, chest pain, dyspnea on exertion, ear congestion, ear pain, fever, headaches, heartburn, malaise/fatigue, myalgias, nasal congestion, orthopnea, PND, postnasal drip, rhinorrhea, sneezing, sore throat, sweats, trouble swallowing or weight loss. Her symptoms are aggravated by URI. Her symptoms are alleviated by beta-agonist. She reports moderate improvement on treatment. Her symptoms are not alleviated by beta-agonist. Her past medical history is significant for asthma. There is no history of bronchiectasis, bronchitis, COPD, emphysema or pneumonia.    Lab Results  Component Value Date   CREATININE 0.77 01/21/2013   BUN 10 01/21/2013   NA 138 01/21/2013   K 3.4 (L) 01/21/2013   CL 106 01/21/2013   CO2 26 01/21/2013   No results found for: CHOL, HDL, LDLCALC, LDLDIRECT, TRIG, CHOLHDL No results found for: TSH No results found for: HGBA1C Lab Results  Component Value Date   WBC 7.1 01/21/2013   HGB 12.8 01/21/2013   HCT 37.1 01/21/2013   MCV 72 (L) 01/21/2013   PLT 347 01/21/2013   Lab Results  Component Value Date   ALT 22 01/21/2013   AST 23 01/21/2013   ALKPHOS 82 01/21/2013   BILITOT 0.7 01/21/2013     Review of Systems  Constitutional: Negative.  Negative for appetite change, chills, fatigue, fever, malaise/fatigue, unexpected weight change and weight loss.  HENT: Negative for congestion, ear discharge, ear pain, hoarse voice, postnasal drip, rhinorrhea, sinus pressure, sneezing,  sore throat and trouble swallowing.   Eyes: Negative for photophobia, pain, discharge, redness and itching.  Respiratory: Negative for cough, hemoptysis, sputum production, choking, chest tightness, shortness of breath, wheezing and stridor.   Cardiovascular: Negative for chest pain, dyspnea on exertion, palpitations, leg swelling and PND.  Gastrointestinal: Negative for abdominal pain, blood in stool, constipation, diarrhea, heartburn, nausea and vomiting.  Endocrine: Negative for cold intolerance, heat intolerance, polydipsia, polyphagia and polyuria.  Genitourinary: Negative for dysuria, flank pain, frequency, hematuria, menstrual problem, pelvic pain, urgency, vaginal bleeding and vaginal discharge.  Musculoskeletal: Negative for arthralgias, back pain and myalgias.  Skin: Negative for rash.  Allergic/Immunologic: Negative for environmental allergies and food allergies.  Neurological: Negative for dizziness, weakness, light-headedness, numbness and headaches.  Hematological: Negative for adenopathy. Does not bruise/bleed easily.  Psychiatric/Behavioral: Negative for dysphoric mood. The patient is not nervous/anxious.     Patient Active Problem List   Diagnosis Date Noted  . Mild intermittent asthma 05/21/2016  . Abnormal hemoglobin (HCC) 11/12/2014    No Known Allergies  Past Surgical History:  Procedure Laterality Date  . LEG SURGERY Left 2010  . TONSILLECTOMY      Social History   Tobacco Use  . Smoking status: Former Games developer  . Smokeless tobacco: Never Used  Substance Use Topics  . Alcohol use: No  . Drug use: No     Medication list has been reviewed and updated.  Current Meds  Medication Sig  . albuterol (VENTOLIN HFA) 108 (90 Base) MCG/ACT inhaler TAKE 2 PUFFS BY MOUTH EVERY 6 HOURS AS NEEDED FOR WHEEZE OR SHORTNESS OF BREATH  PHQ 2/9 Scores 08/03/2019 12/21/2018  PHQ - 2 Score 0 0  PHQ- 9 Score 0 -    BP Readings from Last 3 Encounters:  08/03/19 120/80    12/21/18 122/82  04/08/18 120/62    Physical Exam Vitals and nursing note reviewed.  Constitutional:      General: She is not in acute distress.    Appearance: She is not diaphoretic.  HENT:     Head: Normocephalic and atraumatic.     Right Ear: Tympanic membrane, ear canal and external ear normal.     Left Ear: Tympanic membrane, ear canal and external ear normal.     Nose: Nose normal. No congestion or rhinorrhea.     Mouth/Throat:     Mouth: Mucous membranes are moist.  Eyes:     General:        Right eye: No discharge.        Left eye: No discharge.     Conjunctiva/sclera: Conjunctivae normal.     Pupils: Pupils are equal, round, and reactive to light.  Neck:     Thyroid: No thyromegaly.     Vascular: No JVD.  Cardiovascular:     Rate and Rhythm: Normal rate and regular rhythm.     Heart sounds: Normal heart sounds. No murmur. No friction rub. No gallop.   Pulmonary:     Effort: Pulmonary effort is normal.     Breath sounds: Normal breath sounds. No wheezing or rhonchi.  Abdominal:     General: Bowel sounds are normal.     Palpations: Abdomen is soft. There is no mass.     Tenderness: There is no abdominal tenderness. There is no guarding.  Musculoskeletal:        General: Normal range of motion.     Cervical back: Normal range of motion and neck supple.  Lymphadenopathy:     Cervical: No cervical adenopathy.  Skin:    General: Skin is warm and dry.     Findings: No erythema.  Neurological:     Mental Status: She is alert.     Deep Tendon Reflexes: Reflexes are normal and symmetric.     Wt Readings from Last 3 Encounters:  08/03/19 201 lb (91.2 kg)  12/21/18 227 lb (103 kg)  04/08/18 213 lb (96.6 kg)    BP 120/80   Pulse 80   Ht 5\' 4"  (1.626 m)   Wt 201 lb (91.2 kg)   LMP 08/03/2019   BMI 34.50 kg/m   Assessment and Plan: 1. Mild intermittent asthma with acute exacerbation in adult Chronic.  Controlled.  Stable.  Patient has intermittent  exacerbation of her asthma which is usually controlled with albuterol inhaler 2 puffs every 6 hours but with occasional need for nebulization with exacerbations. - albuterol (VENTOLIN HFA) 108 (90 Base) MCG/ACT inhaler; TAKE 2 PUFFS BY MOUTH EVERY 6 HOURS AS NEEDED FOR WHEEZE OR SHORTNESS OF BREATH  Dispense: 6.7 g; Refill: 1  2. Asthma with bronchitis Chronic.  Controlled.  Stable.  Has occasional cough associated with URI symptoms.  Chronic. - albuterol (VENTOLIN HFA) 108 (90 Base) MCG/ACT inhaler; TAKE 2 PUFFS BY MOUTH EVERY 6 HOURS AS NEEDED FOR WHEEZE OR SHORTNESS OF BREATH  Dispense: 6.7 g; Refill: 1  3. Mild intermittent asthma without complication .  Controlled.  Intermittent.  Mild in nature.  Continue nebulization as needed flareups and will begin Singulair for prevention of allergies as well as asthmatic concerns. - albuterol (PROVENTIL) (2.5 MG/3ML) 0.083% nebulizer  solution; TAKE 3 MLS BY NEBULIZATION EVERY 6 HOURS AS NEEDED FOR WHEEZING OR SHORTNESS OF BREATH.  Dispense: 75 mL; Refill: 11 - montelukast (SINGULAIR) 10 MG tablet; Take 1 tablet (10 mg total) by mouth at bedtime.  Dispense: 30 tablet; Refill: 3

## 2019-08-05 ENCOUNTER — Telehealth: Payer: 59 | Admitting: Physician Assistant

## 2019-08-05 DIAGNOSIS — J45901 Unspecified asthma with (acute) exacerbation: Secondary | ICD-10-CM | POA: Diagnosis not present

## 2019-08-05 MED ORDER — PREDNISONE 20 MG PO TABS: 40.0000 mg | ORAL_TABLET | Freq: Every day | ORAL | 0 refills | Status: DC

## 2019-08-05 NOTE — Progress Notes (Signed)
Visit for Asthma  Based on what you have shared with me, it looks like you may have a flare up of your asthma.  Asthma is a chronic (ongoing) lung disease which results in airway obstruction, inflammation and hyper-responsiveness.   If you find that your symptoms are not controlled with albuterol and prednisone or you symptoms worsening please go to the emergency room for evaluation.   Asthma symptoms vary from person to person, with common symptoms including nighttime awakening and decreased ability to participate in normal activities as a result of shortness of breath. It is often triggered by changes in weather, changes in the season, changes in air temperature, or inside (home, school, daycare or work) allergens such as animal dander, mold, mildew, woodstoves or cockroaches.   It can also be triggered by hormonal changes, extreme emotion, physical exertion or an upper respiratory tract illness.     It is important to identify the trigger, and then eliminate or avoid the trigger if possible.   If you have been prescribed medications to be taken on a regular basis, it is important to follow the asthma action plan and to follow guidelines to adjust medication in response to increasing symptoms of decreased peak expiratory flow rate  Treatment: I have prescribed: Prednisone 40mg  by mouth per day for 5 - 7 days   I saw that you had a prescription for albuterol already so I did not send in another one  HOME CARE . Only take medications as instructed by your medical team. . Consider wearing a mask or scarf to improve breathing air temperature have been shown to decrease irritation and decrease exacerbations . Get rest. . Taking a steamy shower or using a humidifier may help nasal congestion sand ease sore throat pain. You can place a towel over your head and breathe in the steam from hot  water coming from a faucet. . Using a saline nasal spray works much the same way.  . Cough drops, hare candies and sore throat lozenges may ease your cough.  . Avoid close contacts especially the very you and the elderly . Cover your mouth if you cough or sneeze . Always remember to wash your hands.    GET HELP RIGHT AWAY IF: . You develop worsening symptoms; breathlessness at rest, drowsy, confused or agitated, unable to speak in full sentences . You have coughing fits . You develop a severe headache or visual changes . You develop shortness of breath, difficulty breathing or start having chest pain . Your symptoms persist after you have completed your treatment plan . If your symptoms do not improve within 10 days  MAKE SURE YOU . Understand these instructions. . Will watch your condition. . Will get help right away if you are not doing well or get worse.   Your e-visit answers were reviewed by a board certified advanced clinical practitioner to complete your personal care plan, Depending upon the condition, your plan could have included both over the counter or prescription medications.  Please review your pharmacy choice. Your safety is important to . If you have drug allergies check your prescription carefully. You can use MyChart to ask questions about today's visit, request a non-urgent call back, or ask for a work or school excuse for 24 hours related to this e-Visit. If it has been greater than 24 hours you will need to follow up with your provider, or enter a new e-Visit to address those concerns.  You will get an e-mail in the  next two days asking about your experience. I hope that your e-visit has been valuable and will speed your recovery. Thank you for using e-visits.  Greater than 5 minutes, yet less than 10 minutes of time have been spent researching, coordinating, and implementing care for this patient today

## 2019-08-25 ENCOUNTER — Other Ambulatory Visit: Payer: Self-pay | Admitting: Family Medicine

## 2019-08-25 DIAGNOSIS — J45909 Unspecified asthma, uncomplicated: Secondary | ICD-10-CM

## 2019-08-25 DIAGNOSIS — J4521 Mild intermittent asthma with (acute) exacerbation: Secondary | ICD-10-CM

## 2019-10-10 ENCOUNTER — Other Ambulatory Visit: Payer: Self-pay

## 2019-10-10 ENCOUNTER — Emergency Department
Admission: EM | Admit: 2019-10-10 | Discharge: 2019-10-10 | Disposition: A | Discharge status: Home / Self Care | Payer: 59 | Attending: Emergency Medicine | Admitting: Emergency Medicine

## 2019-10-10 DIAGNOSIS — J45909 Unspecified asthma, uncomplicated: Secondary | ICD-10-CM | POA: Insufficient documentation

## 2019-10-10 DIAGNOSIS — Z79899 Other long term (current) drug therapy: Secondary | ICD-10-CM | POA: Insufficient documentation

## 2019-10-10 DIAGNOSIS — K2900 Acute gastritis without bleeding: Secondary | ICD-10-CM | POA: Insufficient documentation

## 2019-10-10 DIAGNOSIS — Z87891 Personal history of nicotine dependence: Secondary | ICD-10-CM | POA: Insufficient documentation

## 2019-10-10 LAB — URINALYSIS, COMPLETE (UACMP) WITH MICROSCOPIC
Bacteria, UA: NONE SEEN
Bilirubin Urine: NEGATIVE
Glucose, UA: NEGATIVE mg/dL
Ketones, ur: NEGATIVE mg/dL
Leukocytes,Ua: NEGATIVE
Nitrite: NEGATIVE
Protein, ur: 30 mg/dL — AB
Specific Gravity, Urine: 1.005 (ref 1.005–1.030)
pH: 8 (ref 5.0–8.0)

## 2019-10-10 LAB — COMPREHENSIVE METABOLIC PANEL
ALT: 61 U/L — ABNORMAL HIGH (ref 0–44)
AST: 66 U/L — ABNORMAL HIGH (ref 15–41)
Albumin: 4.3 g/dL (ref 3.5–5.0)
Alkaline Phosphatase: 60 U/L (ref 38–126)
Anion gap: 15 (ref 5–15)
BUN: <5 mg/dL — ABNORMAL LOW (ref 6–20)
CO2: 26 mmol/L (ref 22–32)
Calcium: 9.1 mg/dL (ref 8.9–10.3)
Chloride: 100 mmol/L (ref 98–111)
Creatinine, Ser: 0.58 mg/dL (ref 0.44–1.00)
GFR calc Af Amer: >60 mL/min (ref >60)
GFR calc non Af Amer: >60 mL/min (ref >60)
Glucose, Bld: 96 mg/dL (ref 70–99)
Potassium: 3.3 mmol/L — ABNORMAL LOW (ref 3.5–5.1)
Sodium: 141 mmol/L (ref 135–145)
Total Bilirubin: 1.1 mg/dL (ref 0.3–1.2)
Total Protein: 8 g/dL (ref 6.5–8.1)

## 2019-10-10 LAB — CBC
HCT: 37.6 % (ref 36.0–46.0)
Hemoglobin: 13.2 g/dL (ref 12.0–15.0)
MCH: 25.2 pg — ABNORMAL LOW (ref 26.0–34.0)
MCHC: 35.1 g/dL (ref 30.0–36.0)
MCV: 71.8 fL — ABNORMAL LOW (ref 80.0–100.0)
Platelets: 308 10*3/uL (ref 150–400)
RBC: 5.24 MIL/uL — ABNORMAL HIGH (ref 3.87–5.11)
RDW: 15.9 % — ABNORMAL HIGH (ref 11.5–15.5)
WBC: 3.3 10*3/uL — ABNORMAL LOW (ref 4.0–10.5)
nRBC: 0 % (ref 0.0–0.2)

## 2019-10-10 LAB — POCT PREGNANCY, URINE: Preg Test, Ur: NEGATIVE

## 2019-10-10 LAB — LIPASE, BLOOD: Lipase: 42 U/L (ref 11–51)

## 2019-10-10 MED ORDER — SODIUM CHLORIDE 0.9% FLUSH: 3.0000 mL | Freq: Once | INTRAVENOUS | Status: DC

## 2019-10-10 MED ORDER — ONDANSETRON 4 MG PO TBDP: 4.0000 mg | ORAL_TABLET | Freq: Three times a day (TID) | ORAL | 0 refills | Status: DC | PRN

## 2019-10-10 MED ORDER — ONDANSETRON HCL 4 MG/2ML IJ SOLN
4.0000 mg | Freq: Once | INTRAMUSCULAR | Status: AC
  Administered 2019-10-10: 4 mg via INTRAVENOUS
  Filled 2019-10-10: qty 2

## 2019-10-10 MED ORDER — SODIUM CHLORIDE 0.9 % IV SOLN
1000.0000 mL | Freq: Once | INTRAVENOUS | Status: AC
  Administered 2019-10-10: 1000 mL via INTRAVENOUS

## 2019-10-10 NOTE — ED Notes (Addendum)
Pt to room from lobby by wheelchair, pt able to transfer to bed with ease. Pt reports vomiting brown liquid starting yesterday. Pt states she threw up twice in the time she was in the lobby. Denies fever or diarrhea. Pt states she had two shots of tequila but drank heavily on Sunday. Denies daily drinking. Pt in NAD.

## 2019-10-10 NOTE — ED Triage Notes (Signed)
Pt c/o N/v for the past 2-3 days, states she is not able to keep and water or food down. Denies any daily alcohol use. See first nurse note for more details. Pt denies any pain.

## 2019-10-10 NOTE — ED Provider Notes (Signed)
[facility] Emergency Department Provider Note   ____________________________________________    I have reviewed the triage vital signs and the nursing notes.   HISTORY  Chief Complaint Alcohol Problem and Emesis     HPI Jordan Stevens is a 29 y.o. female who presents with complaints of nausea and vomiting. Patient reports heavy alcohol use over the last several days. Felt "hung over" yesterday so drank a little more. Today has been unable to tolerate p.o.'s. Mild epigastric discomfort. No fevers or chills. Has not take anything for this. No history of the same. No history of abdominal surgery. No sick contacts. No recent travel. No diarrhea  Past Medical History:  Diagnosis Date  . Asthma 29 years old    Patient Active Problem List   Diagnosis Date Noted  . Mild intermittent asthma 05/21/2016  . Abnormal hemoglobin (HCC) 11/12/2014    Past Surgical History:  Procedure Laterality Date  . LEG SURGERY Left 2010  . TONSILLECTOMY      Prior to Admission medications   Medication Sig Start Date End Date Taking? Authorizing Provider  albuterol (PROVENTIL) (2.5 MG/3ML) 0.083% nebulizer solution TAKE 3 MLS BY NEBULIZATION EVERY 6 HOURS AS NEEDED FOR WHEEZING OR SHORTNESS OF BREATH. 08/03/19   Duanne Limerick, MD  albuterol (VENTOLIN HFA) 108 (90 Base) MCG/ACT inhaler TAKE 2 PUFFS BY MOUTH EVERY 6 HOURS AS NEEDED FOR WHEEZE OR SHORTNESS OF BREATH 08/25/19   Duanne Limerick, MD  azithromycin (ZITHROMAX) 250 MG tablet 2 today then 1 a day for 4 days Patient not taking: Reported on 08/03/2019 12/21/18   Duanne Limerick, MD  guaiFENesin-codeine (ROBITUSSIN AC) 100-10 MG/5ML syrup Take 5 mLs by mouth 4 (four) times daily as needed for cough. Patient not taking: Reported on 08/03/2019 12/21/18   Duanne Limerick, MD  montelukast (SINGULAIR) 10 MG tablet Take 1 tablet (10 mg total) by mouth at bedtime. 08/03/19   Duanne Limerick, MD  ondansetron (ZOFRAN ODT) 4 MG  disintegrating tablet Take 1 tablet (4 mg total) by mouth every 8 (eight) hours as needed. 10/10/19   Jene Every, MD  predniSONE (DELTASONE) 20 MG tablet Take 2 tablets (40 mg total) by mouth daily with breakfast. 08/05/19   Hedges, Tinnie Gens, PA-C     Allergies Patient has no known allergies.  No family history on file.  Social History Social History   Tobacco Use  . Smoking status: Former Games developer  . Smokeless tobacco: Never Used  Substance Use Topics  . Alcohol use: Yes  . Drug use: No    Review of Systems  Constitutional: No fever/chills Eyes: No visual changes.  ENT: No sore throat. Cardiovascular: Denies chest pain. Respiratory: Denies shortness of breath. Gastrointestinal: As above Genitourinary: Negative for dysuria. Musculoskeletal: Negative for back pain. Skin: Negative for rash. Neurological: Negative for headaches    ____________________________________________   PHYSICAL EXAM:  VITAL SIGNS: ED Triage Vitals [10/10/19 1119]  Enc Vitals Group     BP 133/79     Pulse Rate 94     Resp 16     Temp 98.6 F (37 C)     Temp Source Oral     SpO2 98 %     Weight 93 kg (205 lb)     Height 1.626 m (5\' 4" )     Head Circumference      Peak Flow      Pain Score 0     Pain Loc  Pain Edu?      Excl. in [place]?     Constitutional: Alert and oriented.   Nose: No congestion/rhinnorhea. Mouth/Throat: Mucous membranes are moist.    Cardiovascular: Normal rate, regular rhythm. Grossly normal heart sounds.  Good peripheral circulation. Respiratory: Normal respiratory effort.  No retractions.. Gastrointestinal: Soft and nontender. No distention.  No CVA tenderness.  Musculoskeletal: Warm and well perfused Neurologic:  Normal speech and language. No gross focal neurologic deficits are appreciated.  Skin:  Skin is warm, dry and intact. No rash noted. Psychiatric: Mood and affect are normal. Speech and behavior are  normal.  ____________________________________________   LABS (all labs ordered are listed, but only abnormal results are displayed)  Labs Reviewed  COMPREHENSIVE METABOLIC PANEL - Abnormal; Notable for the following components:      Result Value   Potassium 3.3 (*)    BUN <5 (*)    AST 66 (*)    ALT 61 (*)    All other components within normal limits  CBC - Abnormal; Notable for the following components:   WBC 3.3 (*)    RBC 5.24 (*)    MCV 71.8 (*)    MCH 25.2 (*)    RDW 15.9 (*)    All other components within normal limits  URINALYSIS, COMPLETE (UACMP) WITH MICROSCOPIC - Abnormal; Notable for the following components:   Color, Urine YELLOW (*)    APPearance CLEAR (*)    Hgb urine dipstick MODERATE (*)    Protein, ur 30 (*)    All other components within normal limits  LIPASE, BLOOD  POC URINE PREG, ED  POCT PREGNANCY, URINE   ____________________________________________  EKG  None ____________________________________________  RADIOLOGY  None ____________________________________________   PROCEDURES  Procedure(s) performed: No  Procedures   Critical Care performed: No ____________________________________________   INITIAL IMPRESSION / ASSESSMENT AND PLAN / ED COURSE  Pertinent labs & imaging results that were available during my care of the patient were reviewed by me and considered in my medical decision making (see chart for details).  Patient presents with symptoms as noted above. Differential includes alcohol induced gastritis, PUD, less likely pancreatitis.  Lab work today is overall reassuring, low white blood cell count noted, patient will require follow-up for this. Minimal elevation in AST and ALT likely related to recent alcohol use. Will give IV fluids, IV Zofran. Supportive care.  Patient feeling better after IV fluids and Zofran.  Will Rx ODT Zofran, outpatient follow-up with PCP recommended for lab follow-up     ____________________________________________   FINAL CLINICAL IMPRESSION(S) / ED DIAGNOSES  Final diagnoses:  Acute gastritis without hemorrhage, unspecified gastritis type        Note:  This document was prepared using Dragon voice recognition software and may include unintentional dictation errors.   Lavonia Drafts, MD 10/10/19 1501

## 2019-10-10 NOTE — ED Triage Notes (Signed)
Patient to ED via [facility]. Patient states she got a DWI a few days ago and has been drinking liquor heavily since. Patient states her last drink was 2 days ago and she has been having trouble keeping or food or water down since.

## 2019-10-29 ENCOUNTER — Other Ambulatory Visit: Payer: Self-pay | Admitting: Family Medicine

## 2019-10-29 DIAGNOSIS — J45909 Unspecified asthma, uncomplicated: Secondary | ICD-10-CM

## 2019-10-29 DIAGNOSIS — J4521 Mild intermittent asthma with (acute) exacerbation: Secondary | ICD-10-CM

## 2019-10-29 NOTE — Telephone Encounter (Signed)
Requested Prescriptions  Pending Prescriptions Disp Refills  . albuterol (VENTOLIN HFA) 108 (90 Base) MCG/ACT inhaler [Pharmacy Med Name: ALBUTEROL HFA (PROVENTIL) INH] 6.7 g 1    Sig: TAKE 2 PUFFS BY MOUTH EVERY 6 HOURS AS NEEDED FOR WHEEZE OR SHORTNESS OF BREATH     Pulmonology:  Beta Agonists Failed - 10/29/2019 11:41 AM      Failed - One inhaler should last at least one month. If the patient is requesting refills earlier, contact the patient to check for uncontrolled symptoms.      Passed - Valid encounter within last 12 months    Recent Outpatient Visits          2 months ago Mild intermittent asthma with acute exacerbation in adult   [facility] Duanne Limerick, MD   10 months ago Mild intermittent asthma with acute exacerbation in adult   [facility] Duanne Limerick, MD   1 year ago Mild intermittent asthma without complication   [facility] Duanne Limerick, MD   2 years ago Mild intermittent asthma without complication   [facility] Duanne Limerick, MD   3 years ago Mild intermittent asthma without complication   [facility] Duanne Limerick, MD      Future Appointments            In 6 months Duanne Limerick, MD [facility], [facility]

## 2020-05-10 ENCOUNTER — Ambulatory Visit: Payer: Self-pay | Admitting: Family Medicine

## 2020-05-10 ENCOUNTER — Emergency Department
Admission: EM | Admit: 2020-05-10 | Discharge: 2020-05-11 | Disposition: A | Discharge status: Home / Self Care | Payer: Self-pay | Attending: Emergency Medicine | Admitting: Emergency Medicine

## 2020-05-10 DIAGNOSIS — Z87891 Personal history of nicotine dependence: Secondary | ICD-10-CM | POA: Insufficient documentation

## 2020-05-10 DIAGNOSIS — Z7952 Long term (current) use of systemic steroids: Secondary | ICD-10-CM | POA: Insufficient documentation

## 2020-05-10 DIAGNOSIS — F10920 Alcohol use, unspecified with intoxication, uncomplicated: Secondary | ICD-10-CM

## 2020-05-10 DIAGNOSIS — J4520 Mild intermittent asthma, uncomplicated: Secondary | ICD-10-CM | POA: Insufficient documentation

## 2020-05-10 DIAGNOSIS — F10129 Alcohol abuse with intoxication, unspecified: Secondary | ICD-10-CM | POA: Insufficient documentation

## 2020-05-10 NOTE — ED Triage Notes (Signed)
Patient arrived from home by [facility]. Per EMS patient's father called 911 for patient to come and be evaluated. No other info was given on why patient needed to be evaluated. Patient states she has been drinking a lot. Patient could not give writer the amount just states, "A lot". Patient denies SI/HI/AVH. EDP provider at bedside during assessment.

## 2020-05-11 ENCOUNTER — Other Ambulatory Visit: Payer: Self-pay

## 2020-05-11 LAB — CBG MONITORING, ED: Glucose-Capillary: 95 mg/dL (ref 70–99)

## 2020-05-11 NOTE — ED Notes (Signed)
Patient in on phone trying to find ride home.

## 2020-05-11 NOTE — ED Provider Notes (Addendum)
[facility] Provider Note  ____________________________________________   Event Date/Time   First MD Initiated Contact with Patient 05/10/20 2354     (approximate)  I have reviewed the triage vital signs and the nursing notes.   HISTORY  Chief Complaint Alcohol Intoxication    HPI Jordan [facility] is a 30 y.o. female with history of asthma who presents to the emergency department with alcohol intoxication.  States she drank "a lot" today.  Denies drug use.  No SI, HI, hallucinations.  Her father called 911.  Spoke to patient's father on the phone.  He states that he was concerned that she had a drinking problem and wanted to get her help.        Past Medical History:  Diagnosis Date  . Asthma 30 years old    Patient Active Problem List   Diagnosis Date Noted  . Mild intermittent asthma 05/21/2016  . Abnormal hemoglobin (HCC) 11/12/2014    Past Surgical History:  Procedure Laterality Date  . LEG SURGERY Left 2010  . TONSILLECTOMY      Prior to Admission medications   Medication Sig Start Date End Date Taking? Authorizing Provider  albuterol (PROVENTIL) (2.5 MG/3ML) 0.083% nebulizer solution TAKE 3 MLS BY NEBULIZATION EVERY 6 HOURS AS NEEDED FOR WHEEZING OR SHORTNESS OF BREATH. 08/03/19   Duanne Limerick, MD  albuterol (VENTOLIN HFA) 108 (90 Base) MCG/ACT inhaler TAKE 2 PUFFS BY MOUTH EVERY 6 HOURS AS NEEDED FOR WHEEZE OR SHORTNESS OF BREATH 10/29/19   Duanne Limerick, MD  azithromycin (ZITHROMAX) 250 MG tablet 2 today then 1 a day for 4 days Patient not taking: Reported on 08/03/2019 12/21/18   Duanne Limerick, MD  guaiFENesin-codeine (ROBITUSSIN AC) 100-10 MG/5ML syrup Take 5 mLs by mouth 4 (four) times daily as needed for cough. Patient not taking: Reported on 08/03/2019 12/21/18   Duanne Limerick, MD  montelukast (SINGULAIR) 10 MG tablet Take 1 tablet (10 mg total) by mouth at bedtime. 08/03/19   Duanne Limerick, MD  ondansetron  (ZOFRAN ODT) 4 MG disintegrating tablet Take 1 tablet (4 mg total) by mouth every 8 (eight) hours as needed. 10/10/19   Jene Every, MD  predniSONE (DELTASONE) 20 MG tablet Take 2 tablets (40 mg total) by mouth daily with breakfast. 08/05/19   Hedges, Tinnie Gens, PA-C    Allergies Patient has no known allergies.  History reviewed. No pertinent family history.  Social History Social History   Tobacco Use  . Smoking status: Former Games developer  . Smokeless tobacco: Never Used  Substance Use Topics  . Alcohol use: Yes  . Drug use: No    Review of Systems Constitutional: No fever. Eyes: No visual changes. ENT: No sore throat. Cardiovascular: Denies chest pain. Respiratory: Denies shortness of breath. Gastrointestinal: No nausea, vomiting, diarrhea. Genitourinary: Negative for dysuria. Musculoskeletal: Negative for back pain. Skin: Negative for rash. Neurological: Negative for focal weakness or numbness.  ____________________________________________   PHYSICAL EXAM:  VITAL SIGNS:  Vitals:   05/10/20 2355 05/11/20 0001  BP:  103/67  Pulse:  90  Resp:  18  Temp:  (!) 97.5 F (36.4 C)  TempSrc:  Oral  SpO2: 98% 100%  Weight:  90.7 kg  Height:  5\' 4"  (1.626 m)   CONSTITUTIONAL: Alert and oriented and responds appropriately to questions. Well-appearing; well-nourished HEAD: Normocephalic EYES: Conjunctivae clear, pupils appear equal, EOM appear intact ENT: normal nose; moist mucous membranes NECK: Supple, normal ROM CARD: RRR; S1 and  S2 appreciated; no murmurs, no clicks, no rubs, no gallops RESP: Normal chest excursion without splinting or tachypnea; breath sounds clear and equal bilaterally; no wheezes, no rhonchi, no rales, no hypoxia or respiratory distress, speaking full sentences ABD/GI: Normal bowel sounds; non-distended; soft, non-tender, no rebound, no guarding, no peritoneal signs, no hepatosplenomegaly BACK: The back appears normal EXT: Normal ROM in all joints; no  deformity noted, no edema; no cyanosis SKIN: Normal color for age and race; warm; no rash on exposed skin NEURO: Moves all extremities equally; ambulate with steady gait PSYCH: The patient's mood and manner are appropriate.  Appears intoxicated.  No SI, HI or hallucinations.  ____________________________________________   LABS (all labs ordered are listed, but only abnormal results are displayed)  Labs Reviewed  CBG MONITORING, ED   ____________________________________________  EKG  None ____________________________________________  RADIOLOGY I, Deysy Schabel, personally viewed and evaluated these images (plain radiographs) as part of my medical decision making, as well as reviewing the written report by the radiologist.  ED MD interpretation: None  Official radiology report(s): No results found.  ____________________________________________   PROCEDURES  Procedure(s) performed (including Critical Care):  Procedures  ____________________________________________   INITIAL IMPRESSION / ASSESSMENT AND PLAN / ED COURSE  As part of my medical decision making, I reviewed the following data within the electronic MEDICAL RECORD NUMBER History obtained from family, Nursing notes reviewed and incorporated, Old chart reviewed, Notes from prior ED visits and [place] Controlled Substance Database         Patient here for alcohol intoxication.  She denies any psychiatric concerns.  No signs of alcohol withdrawal.  She states that her father called 911 because "he had a bad experience before so he thinks that I am going to have a bad experience".  Spoke to patient's father, [facility], on the phone.  He states that he would like to get resources for her to help with her drinking.  Discussed with father that I can provide him with outpatient resources.  He is comfortable with this plan.  Family trying to get to the emergency department to pick patient up but may have to wait until the morning  due to bad weather and road conditions.      ____________________________________________   FINAL CLINICAL IMPRESSION(S) / ED DIAGNOSES  Final diagnoses:  Alcoholic intoxication without complication [facility])     ED Discharge Orders    None      *Please note:  Alecea Trego was evaluated in Emergency Department on 05/11/2020 for the symptoms described in the history of present illness. She was evaluated in the context of the global COVID-19 pandemic, which necessitated consideration that the patient might be at risk for infection with the SARS-CoV-2 virus that causes COVID-19. Institutional protocols and algorithms that pertain to the evaluation of patients at risk for COVID-19 are in a state of rapid change based on information released by regulatory bodies including the CDC and federal and state organizations. These policies and algorithms were followed during the patient's care in the ED.  Some ED evaluations and interventions may be delayed as a result of limited staffing during and the pandemic.*   Note:  This document was prepared using Dragon voice recognition software and may include unintentional dictation errors.   Tobi Leinweber, Layla Maw, DO 05/11/20 0052    Iliani Vejar, Layla Maw, DO 05/11/20 0126

## 2020-05-11 NOTE — ED Notes (Signed)
Patient able to walk with no issues during discharge. Discharge paperwork reviewed with patient. Detox info given if patient wanting help with detox. Patient verbally stated she understood paperwork. Patient's father picked up patient. This Clinical research associate walked patient to father's car.

## 2020-09-09 ENCOUNTER — Emergency Department
Admission: EM | Admit: 2020-09-09 | Discharge: 2020-09-09 | Disposition: A | Discharge status: Home / Self Care | Payer: Self-pay | Attending: Emergency Medicine | Admitting: Emergency Medicine

## 2020-09-09 ENCOUNTER — Other Ambulatory Visit: Payer: Self-pay

## 2020-09-09 ENCOUNTER — Encounter: Payer: Self-pay | Admitting: Emergency Medicine

## 2020-09-09 DIAGNOSIS — K2920 Alcoholic gastritis without bleeding: Secondary | ICD-10-CM | POA: Insufficient documentation

## 2020-09-09 DIAGNOSIS — J45909 Unspecified asthma, uncomplicated: Secondary | ICD-10-CM | POA: Insufficient documentation

## 2020-09-09 DIAGNOSIS — R1013 Epigastric pain: Secondary | ICD-10-CM

## 2020-09-09 DIAGNOSIS — B9689 Other specified bacterial agents as the cause of diseases classified elsewhere: Secondary | ICD-10-CM | POA: Insufficient documentation

## 2020-09-09 DIAGNOSIS — N3000 Acute cystitis without hematuria: Secondary | ICD-10-CM | POA: Insufficient documentation

## 2020-09-09 LAB — COMPREHENSIVE METABOLIC PANEL
ALT: 30 U/L (ref 0–44)
AST: 43 U/L — ABNORMAL HIGH (ref 15–41)
Albumin: 4.6 g/dL (ref 3.5–5.0)
Alkaline Phosphatase: 59 U/L (ref 38–126)
Anion gap: 16 — ABNORMAL HIGH (ref 5–15)
BUN: 6 mg/dL (ref 6–20)
CO2: 23 mmol/L (ref 22–32)
Calcium: 9.2 mg/dL (ref 8.9–10.3)
Chloride: 98 mmol/L (ref 98–111)
Creatinine, Ser: 0.73 mg/dL (ref 0.44–1.00)
GFR, Estimated: >60 mL/min (ref >60)
Glucose, Bld: 99 mg/dL (ref 70–99)
Potassium: 3.2 mmol/L — ABNORMAL LOW (ref 3.5–5.1)
Sodium: 137 mmol/L (ref 135–145)
Total Bilirubin: 1.7 mg/dL — ABNORMAL HIGH (ref 0.3–1.2)
Total Protein: 8.2 g/dL — ABNORMAL HIGH (ref 6.5–8.1)

## 2020-09-09 LAB — CBC
HCT: 37.5 % (ref 36.0–46.0)
Hemoglobin: 12.7 g/dL (ref 12.0–15.0)
MCH: 24.1 pg — ABNORMAL LOW (ref 26.0–34.0)
MCHC: 33.9 g/dL (ref 30.0–36.0)
MCV: 71 fL — ABNORMAL LOW (ref 80.0–100.0)
Platelets: 404 10*3/uL — ABNORMAL HIGH (ref 150–400)
RBC: 5.28 MIL/uL — ABNORMAL HIGH (ref 3.87–5.11)
RDW: 17.7 % — ABNORMAL HIGH (ref 11.5–15.5)
WBC: 5.9 10*3/uL (ref 4.0–10.5)
nRBC: 0 % (ref 0.0–0.2)

## 2020-09-09 LAB — URINALYSIS, COMPLETE (UACMP) WITH MICROSCOPIC
Bilirubin Urine: NEGATIVE
Glucose, UA: NEGATIVE mg/dL
Hgb urine dipstick: NEGATIVE
Ketones, ur: 5 mg/dL — AB
Leukocytes,Ua: NEGATIVE
Nitrite: POSITIVE — AB
Protein, ur: 100 mg/dL — AB
Specific Gravity, Urine: 1.015 (ref 1.005–1.030)
pH: 7 (ref 5.0–8.0)

## 2020-09-09 LAB — LIPASE, BLOOD: Lipase: 38 U/L (ref 11–51)

## 2020-09-09 LAB — POC URINE PREG, ED: Preg Test, Ur: NEGATIVE

## 2020-09-09 MED ORDER — CEFDINIR 300 MG PO CAPS
300.0000 mg | ORAL_CAPSULE | Freq: Two times a day (BID) | ORAL | 0 refills | Status: AC
Start: 2020-09-09 — End: 2020-09-14

## 2020-09-09 MED ORDER — FAMOTIDINE 40 MG PO TABS
40.0000 mg | ORAL_TABLET | Freq: Every evening | ORAL | 1 refills | Status: DC
Start: 2020-09-09 — End: 2020-10-16

## 2020-09-09 MED ORDER — ONDANSETRON 4 MG PO TBDP
4.0000 mg | ORAL_TABLET | Freq: Three times a day (TID) | ORAL | 0 refills | Status: DC | PRN
Start: 2020-09-09 — End: 2020-10-16

## 2020-09-09 NOTE — ED Triage Notes (Signed)
Pt c/o generalized abd pain at this time, states "I feel like shit". Pt states went on a drinking binge x 1 week, last drink yesterday, pt states has been unable to tolerate fluids, has been vomiting. Pt A&O x4.
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
## 2020-09-10 NOTE — ED Provider Notes (Signed)
[facility] Emergency Department Provider Note   ____________________________________________   Event Date/Time   First MD Initiated Contact with Patient 09/09/20 2125     (approximate)  I have reviewed the triage vital signs and the nursing notes.   HISTORY  Chief Complaint Abdominal Pain    HPI [facility] is a 30 y.o. female with below stated past medical history as well as recent alcohol abuse who presents for midepigastric abdominal pain that is 9/10 in severity radiates up to the back of her throat and is associated with nausea/vomiting.  Patient states that the symptoms began after approximately 5 days of binge drinking and then quitting.  Patient has not tried any medications for the symptoms.  Patient currently denies any vision changes, tinnitus, difficulty speaking, facial droop, sore throat, chest pain, shortness of breath, diarrhea, dysuria, or weakness/numbness/paresthesias in any extremity         Past Medical History:  Diagnosis Date  . Asthma 30 years old    Patient Active Problem List   Diagnosis Date Noted  . Mild intermittent asthma 05/21/2016  . Abnormal hemoglobin (HCC) 11/12/2014    Past Surgical History:  Procedure Laterality Date  . LEG SURGERY Left 2010  . TONSILLECTOMY      Prior to Admission medications   Medication Sig Start Date End Date Taking? Authorizing Provider  cefdinir (OMNICEF) 300 MG capsule Take 1 capsule (300 mg total) by mouth 2 (two) times daily for 5 days. 09/09/20 09/14/20 Yes Merwyn Katos, MD  famotidine (PEPCID) 40 MG tablet Take 1 tablet (40 mg total) by mouth every evening. 09/09/20 09/09/21 Yes Demontrez Rindfleisch, Clent Jacks, MD  ondansetron (ZOFRAN ODT) 4 MG disintegrating tablet Take 1 tablet (4 mg total) by mouth every 8 (eight) hours as needed for nausea or vomiting. 09/09/20  Yes Kacy Hegna, Clent Jacks, MD  albuterol (PROVENTIL) (2.5 MG/3ML) 0.083% nebulizer solution TAKE 3 MLS BY NEBULIZATION EVERY 6 HOURS AS  NEEDED FOR WHEEZING OR SHORTNESS OF BREATH. 08/03/19   Duanne Limerick, MD  albuterol (VENTOLIN HFA) 108 (90 Base) MCG/ACT inhaler TAKE 2 PUFFS BY MOUTH EVERY 6 HOURS AS NEEDED FOR WHEEZE OR SHORTNESS OF BREATH 10/29/19   Duanne Limerick, MD  azithromycin (ZITHROMAX) 250 MG tablet 2 today then 1 a day for 4 days Patient not taking: Reported on 08/03/2019 12/21/18   Duanne Limerick, MD  guaiFENesin-codeine (ROBITUSSIN AC) 100-10 MG/5ML syrup Take 5 mLs by mouth 4 (four) times daily as needed for cough. Patient not taking: Reported on 08/03/2019 12/21/18   Duanne Limerick, MD  montelukast (SINGULAIR) 10 MG tablet Take 1 tablet (10 mg total) by mouth at bedtime. 08/03/19   Duanne Limerick, MD  ondansetron (ZOFRAN ODT) 4 MG disintegrating tablet Take 1 tablet (4 mg total) by mouth every 8 (eight) hours as needed. 10/10/19   Jene Every, MD  predniSONE (DELTASONE) 20 MG tablet Take 2 tablets (40 mg total) by mouth daily with breakfast. 08/05/19   Hedges, Tinnie Gens, PA-C    Allergies Patient has no known allergies.  History reviewed. No pertinent family history.  Social History Social History   Tobacco Use  . Smoking status: Former Games developer  . Smokeless tobacco: Never Used  Substance Use Topics  . Alcohol use: Yes  . Drug use: No    Review of Systems Constitutional: No fever/chills Eyes: No visual changes. ENT: No sore throat. Cardiovascular: Denies chest pain. Respiratory: Denies shortness of breath. Gastrointestinal: Endorses epigastric abdominal pain.  No nausea, no vomiting.  No diarrhea. Genitourinary: Negative for dysuria. Musculoskeletal: Negative for acute arthralgias Skin: Negative for rash. Neurological: Negative for headaches, weakness/numbness/paresthesias in any extremity Psychiatric: Negative for suicidal ideation/homicidal ideation   ____________________________________________   PHYSICAL EXAM:  VITAL SIGNS: ED Triage Vitals [09/09/20 1636]  Enc Vitals Group     BP (!)  136/102     Pulse Rate (!) 104     Resp 20     Temp 97.6 F (36.4 C)     Temp Source Oral     SpO2 98 %     Weight 190 lb (86.2 kg)     Height 5\' 4"  (1.626 m)     Head Circumference      Peak Flow      Pain Score 6     Pain Loc      Pain Edu?      Excl. in GC?    Constitutional: Alert and oriented. Well appearing and in no acute distress. Eyes: Conjunctivae are normal. PERRL. Head: Atraumatic. Nose: No congestion/rhinnorhea. Mouth/Throat: Mucous membranes are moist. Neck: No stridor Cardiovascular: Grossly normal heart sounds.  Good peripheral circulation. Respiratory: Normal respiratory effort.  No retractions. Gastrointestinal: Soft and mild tenderness palpation in the epigastric region. No distention. Musculoskeletal: No obvious deformities Neurologic:  Normal speech and language. No gross focal neurologic deficits are appreciated. Skin:  Skin is warm and dry. No rash noted. Psychiatric: Mood and affect are normal. Speech and behavior are normal.  ____________________________________________   LABS (all labs ordered are listed, but only abnormal results are displayed)  Labs Reviewed  COMPREHENSIVE METABOLIC PANEL - Abnormal; Notable for the following components:      Result Value   Potassium 3.2 (*)    Total Protein 8.2 (*)    AST 43 (*)    Total Bilirubin 1.7 (*)    Anion gap 16 (*)    All other components within normal limits  CBC - Abnormal; Notable for the following components:   RBC 5.28 (*)    MCV 71.0 (*)    MCH 24.1 (*)    RDW 17.7 (*)    Platelets 404 (*)    All other components within normal limits  URINALYSIS, COMPLETE (UACMP) WITH MICROSCOPIC - Abnormal; Notable for the following components:   Color, Urine YELLOW (*)    APPearance HAZY (*)    Ketones, ur 5 (*)    Protein, ur 100 (*)    Nitrite POSITIVE (*)    Bacteria, UA MANY (*)    All other components within normal limits  LIPASE, BLOOD  POC URINE PREG, ED    ____________________________________________  EKG  ED ECG REPORT I, , the attending physician, personally viewed and interpreted this ECG.  Date: 09/10/2020 EKG Time: 1640 Rate: 92 Rhythm: normal sinus rhythm QRS Axis: normal Intervals: normal ST/T Wave abnormalities: normal Narrative Interpretation: no evidence of acute ischemia   PROCEDURES  Procedure(s) performed (including Critical Care):  .1-3 Lead EKG Interpretation Performed by: 09/12/2020, MD Authorized by: Merwyn Katos, MD     Interpretation: normal     ECG rate:  86   ECG rate assessment: normal     Rhythm: sinus rhythm     Ectopy: none     Conduction: normal       ____________________________________________   INITIAL IMPRESSION / ASSESSMENT AND PLAN / ED COURSE  As part of my medical decision making, I reviewed the following data within the electronic  MEDICAL RECORD NUMBER Nursing notes reviewed and incorporated, Labs reviewed, EKG interpreted, Old chart reviewed, Radiograph reviewed and Notes from prior ED visits reviewed and incorporated        Patients symptoms not typical for emergent causes of abdominal pain such as, but not limited to, appendicitis, abdominal aortic aneurysm, surgical biliary disease, pancreatitis, SBO, mesenteric ischemia, serious intra-abdominal bacterial illness. Presentation also not typical of gynecologic emergencies such as TOA, Ovarian Torsion, PID. Not Ectopic. Doubt atypical ACS. Likely alcoholic gastritis Pt tolerating PO. Disposition: Patient will be discharged with strict return precautions and follow up with primary MD within 12-24 hours for further evaluation. Patient understands that this still may have an early presentation of an emergent medical condition such as appendicitis that will require a recheck.      ____________________________________________   FINAL CLINICAL IMPRESSION(S) / ED DIAGNOSES  Final diagnoses:  Acute alcoholic  gastritis without hemorrhage  Epigastric pain  Acute cystitis without hematuria     ED Discharge Orders         Ordered    cefdinir (OMNICEF) 300 MG capsule  2 times daily        09/09/20 2203    ondansetron (ZOFRAN ODT) 4 MG disintegrating tablet  Every 8 hours PRN        09/09/20 2203    famotidine (PEPCID) 40 MG tablet  Every evening        09/09/20 2203           Note:  This document was prepared using Dragon voice recognition software and may include unintentional dictation errors.   Merwyn Katos, MD 09/10/20 2059

## 2020-10-01 ENCOUNTER — Other Ambulatory Visit: Payer: Self-pay | Admitting: Family Medicine

## 2020-10-01 DIAGNOSIS — J45909 Unspecified asthma, uncomplicated: Secondary | ICD-10-CM

## 2020-10-01 DIAGNOSIS — J4521 Mild intermittent asthma with (acute) exacerbation: Secondary | ICD-10-CM

## 2020-10-01 DIAGNOSIS — J4520 Mild intermittent asthma, uncomplicated: Secondary | ICD-10-CM

## 2020-10-01 NOTE — Telephone Encounter (Signed)
Needs appt not seen in over a year

## 2020-10-01 NOTE — Telephone Encounter (Signed)
Left voicemail to set up a refill appointment.

## 2020-10-01 NOTE — Telephone Encounter (Signed)
Requested medication (s) are due for refill today: yes   Requested medication (s) are on the active medication list: yes   Last refill: 07/29/2020  Future visit scheduled: no  Notes to clinic: overdue for follow up appt Message has been sent to patient to contact office    Requested Prescriptions  Pending Prescriptions Disp Refills   albuterol (PROVENTIL) (2.5 MG/3ML) 0.083% nebulizer solution [Pharmacy Med Name: ALBUTEROL SUL 2.5 MG/3 ML SOLN] 75 mL 11    Sig: TAKE 3 MLS BY NEBULIZATION EVERY 6 HOURS AS NEEDED FOR WHEEZING OR SHORTNESS OF BREATH.      Pulmonology:  Beta Agonists Failed - 10/01/2020  1:51 PM      Failed - One inhaler should last at least one month. If the patient is requesting refills earlier, contact the patient to check for uncontrolled symptoms.      Failed - Valid encounter within last 12 months    Recent Outpatient Visits           1 year ago Mild intermittent asthma with acute exacerbation in adult   [facility] Duanne Limerick, MD   1 year ago Mild intermittent asthma with acute exacerbation in adult   [facility] Duanne Limerick, MD   2 years ago Mild intermittent asthma without complication   [facility] Duanne Limerick, MD   3 years ago Mild intermittent asthma without complication   [facility] Duanne Limerick, MD   4 years ago Mild intermittent asthma without complication   [facility] Duanne Limerick, MD                  albuterol (VENTOLIN HFA) 108 (90 Base) MCG/ACT inhaler [Pharmacy Med Name: ALBUTEROL HFA (PROVENTIL) INH] 6.7 each 1    Sig: TAKE 2 PUFFS BY MOUTH EVERY 6 HOURS AS NEEDED FOR WHEEZE OR SHORTNESS OF BREATH      Pulmonology:  Beta Agonists Failed - 10/01/2020  1:51 PM      Failed - One inhaler should last at least one month. If the patient is requesting refills earlier, contact the patient to check for uncontrolled symptoms.      Failed - Valid encounter within last 12 months     Recent Outpatient Visits           1 year ago Mild intermittent asthma with acute exacerbation in adult   [facility] Duanne Limerick, MD   1 year ago Mild intermittent asthma with acute exacerbation in adult   [facility] Duanne Limerick, MD   2 years ago Mild intermittent asthma without complication   [facility] Duanne Limerick, MD   3 years ago Mild intermittent asthma without complication   [facility] Duanne Limerick, MD   4 years ago Mild intermittent asthma without complication   [facility] Duanne Limerick, MD

## 2020-10-13 ENCOUNTER — Encounter: Payer: Self-pay | Admitting: Emergency Medicine

## 2020-10-13 ENCOUNTER — Emergency Department: Payer: Medicaid Other

## 2020-10-13 ENCOUNTER — Other Ambulatory Visit: Payer: Self-pay

## 2020-10-13 ENCOUNTER — Inpatient Hospital Stay
Admission: EM | Admit: 2020-10-13 | Discharge: 2020-10-16 | DRG: 641 | Disposition: A | Discharge status: Home / Self Care | Payer: Medicaid Other | Attending: Internal Medicine | Admitting: Internal Medicine

## 2020-10-13 ENCOUNTER — Inpatient Hospital Stay: Payer: Medicaid Other

## 2020-10-13 DIAGNOSIS — F10931 Alcohol use, unspecified with withdrawal delirium: Secondary | ICD-10-CM

## 2020-10-13 DIAGNOSIS — E872 Acidosis: Secondary | ICD-10-CM | POA: Diagnosis present

## 2020-10-13 DIAGNOSIS — R7989 Other specified abnormal findings of blood chemistry: Secondary | ICD-10-CM | POA: Diagnosis present

## 2020-10-13 DIAGNOSIS — Z6832 Body mass index (BMI) 32.0-32.9, adult: Secondary | ICD-10-CM

## 2020-10-13 DIAGNOSIS — F10221 Alcohol dependence with intoxication delirium: Secondary | ICD-10-CM | POA: Diagnosis present

## 2020-10-13 DIAGNOSIS — E876 Hypokalemia: Secondary | ICD-10-CM | POA: Diagnosis present

## 2020-10-13 DIAGNOSIS — K760 Fatty (change of) liver, not elsewhere classified: Secondary | ICD-10-CM | POA: Diagnosis present

## 2020-10-13 DIAGNOSIS — F17210 Nicotine dependence, cigarettes, uncomplicated: Secondary | ICD-10-CM | POA: Diagnosis present

## 2020-10-13 DIAGNOSIS — K2920 Alcoholic gastritis without bleeding: Secondary | ICD-10-CM | POA: Diagnosis present

## 2020-10-13 DIAGNOSIS — J4520 Mild intermittent asthma, uncomplicated: Secondary | ICD-10-CM | POA: Diagnosis present

## 2020-10-13 DIAGNOSIS — F10231 Alcohol dependence with withdrawal delirium: Secondary | ICD-10-CM | POA: Diagnosis present

## 2020-10-13 DIAGNOSIS — Z833 Family history of diabetes mellitus: Secondary | ICD-10-CM

## 2020-10-13 DIAGNOSIS — F10121 Alcohol abuse with intoxication delirium: Secondary | ICD-10-CM | POA: Diagnosis present

## 2020-10-13 DIAGNOSIS — E669 Obesity, unspecified: Secondary | ICD-10-CM | POA: Diagnosis present

## 2020-10-13 DIAGNOSIS — Z825 Family history of asthma and other chronic lower respiratory diseases: Secondary | ICD-10-CM | POA: Diagnosis not present

## 2020-10-13 DIAGNOSIS — R109 Unspecified abdominal pain: Secondary | ICD-10-CM

## 2020-10-13 DIAGNOSIS — Z811 Family history of alcohol abuse and dependence: Secondary | ICD-10-CM | POA: Diagnosis not present

## 2020-10-13 DIAGNOSIS — Z20822 Contact with and (suspected) exposure to covid-19: Secondary | ICD-10-CM | POA: Diagnosis present

## 2020-10-13 DIAGNOSIS — E8729 Other acidosis: Secondary | ICD-10-CM | POA: Diagnosis present

## 2020-10-13 HISTORY — DX: Alcohol abuse, uncomplicated: F10.10

## 2020-10-13 LAB — COMPREHENSIVE METABOLIC PANEL
ALT: 72 U/L — ABNORMAL HIGH (ref 0–44)
AST: 68 U/L — ABNORMAL HIGH (ref 15–41)
Albumin: 4.7 g/dL (ref 3.5–5.0)
Alkaline Phosphatase: 60 U/L (ref 38–126)
Anion gap: 23 — ABNORMAL HIGH (ref 5–15)
BUN: 23 mg/dL — ABNORMAL HIGH (ref 6–20)
CO2: 17 mmol/L — ABNORMAL LOW (ref 22–32)
Calcium: 9.4 mg/dL (ref 8.9–10.3)
Chloride: 93 mmol/L — ABNORMAL LOW (ref 98–111)
Creatinine, Ser: 1.11 mg/dL — ABNORMAL HIGH (ref 0.44–1.00)
GFR, Estimated: >60 mL/min (ref >60)
Glucose, Bld: 210 mg/dL — ABNORMAL HIGH (ref 70–99)
Potassium: 3.2 mmol/L — ABNORMAL LOW (ref 3.5–5.1)
Sodium: 133 mmol/L — ABNORMAL LOW (ref 135–145)
Total Bilirubin: 1.8 mg/dL — ABNORMAL HIGH (ref 0.3–1.2)
Total Protein: 9 g/dL — ABNORMAL HIGH (ref 6.5–8.1)

## 2020-10-13 LAB — TSH: TSH: 3.226 u[IU]/mL (ref 0.350–4.500)

## 2020-10-13 LAB — URINALYSIS, COMPLETE (UACMP) WITH MICROSCOPIC
Bacteria, UA: NONE SEEN
Bilirubin Urine: NEGATIVE
Glucose, UA: NEGATIVE mg/dL
Ketones, ur: 80 mg/dL — AB
Leukocytes,Ua: NEGATIVE
Nitrite: NEGATIVE
Protein, ur: 100 mg/dL — AB
Specific Gravity, Urine: 1.029 (ref 1.005–1.030)
pH: 6 (ref 5.0–8.0)

## 2020-10-13 LAB — BLOOD GAS, ARTERIAL
Acid-Base Excess: 0.9 mmol/L (ref 0.0–2.0)
Bicarbonate: 23.6 mmol/L (ref 20.0–28.0)
FIO2: 0.21
O2 Saturation: 97.8 %
Patient temperature: 37
pCO2 arterial: 31 mmHg — ABNORMAL LOW (ref 32.0–48.0)
pH, Arterial: 7.49 — ABNORMAL HIGH (ref 7.350–7.450)
pO2, Arterial: 93 mmHg (ref 83.0–108.0)

## 2020-10-13 LAB — RESP PANEL BY RT-PCR (FLU A&B, COVID) ARPGX2
Influenza A by PCR: NEGATIVE
Influenza B by PCR: NEGATIVE
SARS Coronavirus 2 by RT PCR: NEGATIVE

## 2020-10-13 LAB — CBC
HCT: 40.1 % (ref 36.0–46.0)
Hemoglobin: 14 g/dL (ref 12.0–15.0)
MCH: 24.6 pg — ABNORMAL LOW (ref 26.0–34.0)
MCHC: 34.9 g/dL (ref 30.0–36.0)
MCV: 70.6 fL — ABNORMAL LOW (ref 80.0–100.0)
Platelets: 367 10*3/uL (ref 150–400)
RBC: 5.68 MIL/uL — ABNORMAL HIGH (ref 3.87–5.11)
RDW: 19.1 % — ABNORMAL HIGH (ref 11.5–15.5)
WBC: 5.8 10*3/uL (ref 4.0–10.5)
nRBC: 0 % (ref 0.0–0.2)

## 2020-10-13 LAB — PROTIME-INR
INR: 1.1 (ref 0.8–1.2)
Prothrombin Time: 13.7 seconds (ref 11.4–15.2)

## 2020-10-13 LAB — LIPASE, BLOOD: Lipase: 77 U/L — ABNORMAL HIGH (ref 11–51)

## 2020-10-13 LAB — CK: Total CK: 384 U/L — ABNORMAL HIGH (ref 38–234)

## 2020-10-13 LAB — ETHANOL: Alcohol, Ethyl (B): 47 mg/dL — ABNORMAL HIGH (ref <10)

## 2020-10-13 LAB — HCG, QUANTITATIVE, PREGNANCY: hCG, Beta Chain, Quant, S: <1 m[IU]/mL (ref <5)

## 2020-10-13 LAB — MAGNESIUM: Magnesium: 1.6 mg/dL — ABNORMAL LOW (ref 1.7–2.4)

## 2020-10-13 LAB — LACTIC ACID, PLASMA: Lactic Acid, Venous: 3.6 mmol/L (ref 0.5–1.9)

## 2020-10-13 LAB — BETA-HYDROXYBUTYRIC ACID: Beta-Hydroxybutyric Acid: 1.94 mmol/L — ABNORMAL HIGH (ref 0.05–0.27)

## 2020-10-13 MED ORDER — FAMOTIDINE IN NACL 20-0.9 MG/50ML-% IV SOLN
20.0000 mg | Freq: Once | INTRAVENOUS | Status: AC
  Administered 2020-10-13: 20 mg via INTRAVENOUS
  Filled 2020-10-13: qty 50

## 2020-10-13 MED ORDER — ADULT MULTIVITAMIN W/MINERALS CH
1.0000 | ORAL_TABLET | Freq: Every day | ORAL | Status: DC
  Administered 2020-10-13 – 2020-10-16 (×4): 1 via ORAL
  Filled 2020-10-13 (×4): qty 1

## 2020-10-13 MED ORDER — SENNA 8.6 MG PO TABS
1.0000 | ORAL_TABLET | Freq: Two times a day (BID) | ORAL | Status: DC
  Administered 2020-10-13 – 2020-10-15 (×5): 8.6 mg via ORAL
  Filled 2020-10-13 (×6): qty 1

## 2020-10-13 MED ORDER — DEXTROSE 5 % AND 0.9 % NACL IV BOLUS
1000.0000 mL | Freq: Once | INTRAVENOUS | Status: AC
  Administered 2020-10-13: 1000 mL via INTRAVENOUS
  Filled 2020-10-13: qty 1000

## 2020-10-13 MED ORDER — ENOXAPARIN SODIUM 40 MG/0.4ML IJ SOSY
40.0000 mg | PREFILLED_SYRINGE | INTRAMUSCULAR | Status: DC
  Administered 2020-10-13 – 2020-10-14 (×2): 40 mg via SUBCUTANEOUS
  Filled 2020-10-13 (×2): qty 0.4

## 2020-10-13 MED ORDER — PANTOPRAZOLE SODIUM 40 MG PO TBEC
40.0000 mg | DELAYED_RELEASE_TABLET | Freq: Every day | ORAL | Status: DC
  Administered 2020-10-14 – 2020-10-16 (×3): 40 mg via ORAL
  Filled 2020-10-13 (×3): qty 1

## 2020-10-13 MED ORDER — FOLIC ACID 1 MG PO TABS
1.0000 mg | ORAL_TABLET | Freq: Every day | ORAL | Status: DC
  Administered 2020-10-13 – 2020-10-16 (×4): 1 mg via ORAL
  Filled 2020-10-13 (×4): qty 1

## 2020-10-13 MED ORDER — THIAMINE HCL 100 MG/ML IJ SOLN
Freq: Once | INTRAVENOUS | Status: AC
  Filled 2020-10-13: qty 1000

## 2020-10-13 MED ORDER — LORAZEPAM 2 MG/ML IJ SOLN
0.0000 mg | Freq: Four times a day (QID) | INTRAMUSCULAR | Status: DC
  Administered 2020-10-13 – 2020-10-14 (×5): 2 mg via INTRAVENOUS
  Filled 2020-10-13 (×6): qty 1

## 2020-10-13 MED ORDER — MONTELUKAST SODIUM 10 MG PO TABS
10.0000 mg | ORAL_TABLET | Freq: Every day | ORAL | Status: DC
  Administered 2020-10-14 – 2020-10-15 (×2): 10 mg via ORAL
  Filled 2020-10-13 (×3): qty 1

## 2020-10-13 MED ORDER — LORAZEPAM 1 MG PO TABS: 1.0000 mg | ORAL_TABLET | ORAL | Status: AC | PRN

## 2020-10-13 MED ORDER — TRAMADOL HCL 50 MG PO TABS
50.0000 mg | ORAL_TABLET | Freq: Three times a day (TID) | ORAL | Status: DC | PRN
  Administered 2020-10-13 – 2020-10-15 (×2): 50 mg via ORAL
  Filled 2020-10-13 (×2): qty 1

## 2020-10-13 MED ORDER — POTASSIUM CHLORIDE 10 MEQ/100ML IV SOLN
10.0000 meq | Freq: Once | INTRAVENOUS | Status: AC
  Administered 2020-10-13: 10 meq via INTRAVENOUS
  Filled 2020-10-13: qty 100

## 2020-10-13 MED ORDER — LORAZEPAM 2 MG/ML IJ SOLN
1.0000 mg | Freq: Once | INTRAMUSCULAR | Status: AC
  Administered 2020-10-13: 1 mg via INTRAVENOUS
  Filled 2020-10-13: qty 1

## 2020-10-13 MED ORDER — SODIUM CHLORIDE 0.9 % IV BOLUS
1000.0000 mL | Freq: Once | INTRAVENOUS | Status: AC
  Administered 2020-10-13: 1000 mL via INTRAVENOUS

## 2020-10-13 MED ORDER — PANTOPRAZOLE SODIUM 40 MG IV SOLR
40.0000 mg | INTRAVENOUS | Status: AC
  Administered 2020-10-13: 40 mg via INTRAVENOUS
  Filled 2020-10-13: qty 40

## 2020-10-13 MED ORDER — ALBUTEROL SULFATE (2.5 MG/3ML) 0.083% IN NEBU: 2.5000 mg | INHALATION_SOLUTION | Freq: Four times a day (QID) | RESPIRATORY_TRACT | Status: DC | PRN

## 2020-10-13 MED ORDER — KCL IN DEXTROSE-NACL 20-5-0.9 MEQ/L-%-% IV SOLN
INTRAVENOUS | Status: DC
  Filled 2020-10-13 (×11): qty 1000

## 2020-10-13 MED ORDER — LORAZEPAM 2 MG/ML IJ SOLN: 0.0000 mg | Freq: Two times a day (BID) | INTRAMUSCULAR | Status: DC

## 2020-10-13 MED ORDER — THIAMINE HCL 100 MG/ML IJ SOLN
100.0000 mg | Freq: Once | INTRAMUSCULAR | Status: AC
  Administered 2020-10-13: 100 mg via INTRAVENOUS
  Filled 2020-10-13: qty 2

## 2020-10-13 MED ORDER — MAGNESIUM SULFATE 2 GM/50ML IV SOLN
2.0000 g | Freq: Once | INTRAVENOUS | Status: AC
  Administered 2020-10-13: 2 g via INTRAVENOUS
  Filled 2020-10-13: qty 50

## 2020-10-13 MED ORDER — ONDANSETRON 4 MG PO TBDP: 4.0000 mg | ORAL_TABLET | Freq: Three times a day (TID) | ORAL | Status: DC | PRN

## 2020-10-13 MED ORDER — LORAZEPAM 2 MG/ML IJ SOLN
1.0000 mg | INTRAMUSCULAR | Status: AC | PRN
Start: 2020-10-13 — End: 2020-10-16
  Administered 2020-10-13: 2 mg via INTRAVENOUS
  Administered 2020-10-15: 1 mg via INTRAVENOUS
  Filled 2020-10-13 (×2): qty 1

## 2020-10-13 MED ORDER — ALBUTEROL SULFATE HFA 108 (90 BASE) MCG/ACT IN AERS: 2.0000 | INHALATION_SPRAY | Freq: Four times a day (QID) | RESPIRATORY_TRACT | Status: DC | PRN

## 2020-10-13 MED ORDER — ONDANSETRON HCL 4 MG/2ML IJ SOLN
4.0000 mg | INTRAMUSCULAR | Status: AC
  Administered 2020-10-13: 4 mg via INTRAVENOUS
  Filled 2020-10-13: qty 2

## 2020-10-13 NOTE — BH Assessment (Signed)
Writer attempted to complete TTS Consult, but decided it was best to wait for her labs to result before continuing the assessment. Patient was able to answer majority of the questions but was responding to internal stimuli while writer was in the room. She was able to acknowledge what she was seeing wasn't real. Patient is to be admitted to the medical floor.

## 2020-10-13 NOTE — H&P (Signed)
History and Physical    Karalyn Kadel SWF:[id number] DOB: [birth year] DOA: 10/13/2020  PCP: Duanne Limerick, MD (Confirm with patient/family/NH records and if not entered, this has to be entered at [facility] point of entry) Patient coming from: home  I have personally briefly reviewed patient's old medical records in [facility]  Chief Complaint: abdominal pain, hallucinosis  HPI: Jordan Stevens is a 30 y.o. female with medical history significant of asthma and alcohol abuse with multiple ED visits (10/09/19, 05/10/20, 09/09/20) for alcoholic gastritis. She continues to drink greater that 750 ml daily of spirits. On the day of presentation she was having abdominal pain and also marked hallucinosis. She presents to [facility] ED for evaluation.    ED Course: T 97.5  144/103  HR 122  RR 20. Per EDP report she continued to have hallucinations and delerium. Lab reveals glucose 210 , Chloride 93(l), Co2 17 (l), AG 23, Mg 1.6, CK total 384, Lactic acid 3.6, lipase 77  AST 68, ALT 72, CBC nl. In ED she was given 2L NS bolus, IV thiamin, IV Mg, IV K. Maintenance IVF at 250cc/hr running. Abd U/S pending. Patient also given Zofran and Ativan. TRH called to admit for alcohol related ketoacidosis and alcohol withdrawal management.   Review of Systems: As per HPI otherwise 10 point review of systems negative.    Past Medical History:  Diagnosis Date   Alcohol abuse    multiple episode gastritis, episode intoxication with delerium June '22   Asthma 30 years old    Past Surgical History:  Procedure Laterality Date   LEG SURGERY Left 2010   TONSILLECTOMY      Soc Hx - single mother - 72 year old son. Lives independently with her son. Works as a Sales executive. Denies physical or sexual abuse.   reports that she has quit smoking. She has never used smokeless tobacco. She reports current alcohol use. She reports that she does not use drugs.  No Known Allergies  Family History  Problem Relation Age of Onset    Cancer Mother    Alcoholism Father    Diabetes Maternal Grandmother    COPD Paternal Uncle      Prior to Admission medications   Medication Sig Start Date End Date Taking? Authorizing Provider  albuterol (PROVENTIL) (2.5 MG/3ML) 0.083% nebulizer solution TAKE 3 MLS BY NEBULIZATION EVERY 6 HOURS AS NEEDED FOR WHEEZING OR SHORTNESS OF BREATH. 08/03/19   Duanne Limerick, MD  albuterol (VENTOLIN HFA) 108 (90 Base) MCG/ACT inhaler TAKE 2 PUFFS BY MOUTH EVERY 6 HOURS AS NEEDED FOR WHEEZE OR SHORTNESS OF BREATH 10/29/19   Duanne Limerick, MD  azithromycin (ZITHROMAX) 250 MG tablet 2 today then 1 a day for 4 days Patient not taking: Reported on 08/03/2019 12/21/18   Duanne Limerick, MD  famotidine (PEPCID) 40 MG tablet Take 1 tablet (40 mg total) by mouth every evening. 09/09/20 09/09/21  Merwyn Katos, MD  guaiFENesin-codeine (ROBITUSSIN AC) 100-10 MG/5ML syrup Take 5 mLs by mouth 4 (four) times daily as needed for cough. Patient not taking: Reported on 08/03/2019 12/21/18   Duanne Limerick, MD  montelukast (SINGULAIR) 10 MG tablet Take 1 tablet (10 mg total) by mouth at bedtime. 08/03/19   Duanne Limerick, MD  ondansetron (ZOFRAN ODT) 4 MG disintegrating tablet Take 1 tablet (4 mg total) by mouth every 8 (eight) hours as needed. 10/10/19   Jene Every, MD  ondansetron (ZOFRAN ODT) 4 MG disintegrating tablet Take 1  tablet (4 mg total) by mouth every 8 (eight) hours as needed for nausea or vomiting. 09/09/20   Merwyn Katos, MD  predniSONE (DELTASONE) 20 MG tablet Take 2 tablets (40 mg total) by mouth daily with breakfast. 08/05/19   Eyvonne Mechanic, PA-C    Physical Exam: Vitals:   10/13/20 0820 10/13/20 0830 10/13/20 0936 10/13/20 1006  BP: (!) 144/103 (!) 136/94 140/77 (!) 144/77  Pulse: (!) 122 (!) 121 (!) 120 (!) 120  Resp:  (!) 24  17  Temp:    98.4 F (36.9 C)  TempSrc:    Oral  SpO2:  99%  98%  Weight:      Height:         Vitals:   10/13/20 0820 10/13/20 0830 10/13/20 0936 10/13/20  1006  BP: (!) 144/103 (!) 136/94 140/77 (!) 144/77  Pulse: (!) 122 (!) 121 (!) 120 (!) 120  Resp:  (!) 24  17  Temp:    98.4 F (36.9 C)  TempSrc:    Oral  SpO2:  99%  98%  Weight:      Height:       General: overweight woman who is pleasant but hallucinating. Eyes: PERRL, lids and conjunctivae normal ENMT: Mucous membranes are moist. Posterior pharynx clear of any exudate or lesions.Normal dentition with braces.  Neck: normal, supple, no masses, no thyromegaly Respiratory: clear to auscultation bilaterally, no wheezing, no crackles. Normal respiratory effort. No accessory muscle use.  Cardiovascular: Regular tachycardia, no murmurs / rubs / gallops. No extremity edema. 2+ pedal pulses. No carotid bruits.  Abdomen: obese, no tenderness, no masses palpated. No hepatosplenomegaly. Bowel sounds positive.  Musculoskeletal: no clubbing / cyanosis. No joint deformity upper and lower extremities. Good ROM, no contractures. Normal muscle tone.  Skin: no rashes, lesions, ulcers. No induration Neurologic: CN 2-12 grossly intact. Sensation intact, DTR normal. Strength 5/5 in all 4.  Psychiatric: Hallucinating but cogent, able to answer questions. No threatening hallucinations. oriented x 3. Normal mood.     Labs on Admission: I have personally reviewed following labs and imaging studies  CBC: Recent Labs  Lab 10/13/20 0701  WBC 5.8  HGB 14.0  HCT 40.1  MCV 70.6*  PLT 367   Basic Metabolic Panel: Recent Labs  Lab 10/13/20 0701 10/13/20 0827  NA 133*  --   K 3.2*  --   CL 93*  --   CO2 17*  --   GLUCOSE 210*  --   BUN 23*  --   CREATININE 1.11*  --   CALCIUM 9.4  --   MG  --  1.6*   GFR: Estimated Creatinine Clearance: 79.5 mL/min (A) (by C-G formula based on SCr of 1.11 mg/dL (H)). Liver Function Tests: Recent Labs  Lab 10/13/20 0701  AST 68*  ALT 72*  ALKPHOS 60  BILITOT 1.8*  PROT 9.0*  ALBUMIN 4.7   Recent Labs  Lab 10/13/20 0701  LIPASE 77*   No results  for input(s): AMMONIA in the last 168 hours. Coagulation Profile: Recent Labs  Lab 10/13/20 0935  INR 1.1   Cardiac Enzymes: Recent Labs  Lab 10/13/20 0827  CKTOTAL 384*   BNP (last 3 results) No results for input(s): PROBNP in the last 8760 hours. HbA1C: No results for input(s): HGBA1C in the last 72 hours. CBG: No results for input(s): GLUCAP in the last 168 hours. Lipid Profile: No results for input(s): CHOL, HDL, LDLCALC, TRIG, CHOLHDL, LDLDIRECT in the last 72 hours. Thyroid Function  Tests: Recent Labs    10/13/20 0827  TSH 3.226   Anemia Panel: No results for input(s): VITAMINB12, FOLATE, FERRITIN, TIBC, IRON, RETICCTPCT in the last 72 hours. Urine analysis:    Component Value Date/Time   COLORURINE YELLOW (A) 09/09/2020 1638   APPEARANCEUR HAZY (A) 09/09/2020 1638   APPEARANCEUR Hazy 01/21/2013 1604   LABSPEC 1.015 09/09/2020 1638   LABSPEC 1.027 01/21/2013 1604   PHURINE 7.0 09/09/2020 1638   GLUCOSEU NEGATIVE 09/09/2020 1638   GLUCOSEU Negative 01/21/2013 1604   HGBUR NEGATIVE 09/09/2020 1638   BILIRUBINUR NEGATIVE 09/09/2020 1638   BILIRUBINUR Negative 01/21/2013 1604   KETONESUR 5 (A) 09/09/2020 1638   PROTEINUR 100 (A) 09/09/2020 1638   NITRITE POSITIVE (A) 09/09/2020 1638   LEUKOCYTESUR NEGATIVE 09/09/2020 1638   LEUKOCYTESUR 3+ 01/21/2013 1604    Radiological Exams on Admission: US ABDOMEN LIMITED RUQ (LIVER/GB)  Result Date: 10/13/2020 CLINICAL DATA:  Abdominal pain EXAM: ULTRASOUND ABDOMEN LIMITED RIGHT UPPER QUADRANT COMPARISON:  None. FINDINGS: Gallbladder: No gallstones or wall thickening visualized. No sonographic Murphy sign noted by sonographer. Common bile duct: Diameter: 3 mm Liver: Echogenic liver with diminished acoustic penetration. No focal lesion. Portal vein is patent on color Doppler imaging with normal direction of blood flow towards the liver. Other: Borderline increased cortical echogenicity of the visualized right kidney with  prominent corticomedullary differentiation. IMPRESSION: 1. Negative gallbladder. 2. Hepatic steatosis. Electronically Signed   By: Marnee Spring M.D.   On: 10/13/2020 09:24    EKG: Independently reviewed. Sinus tachycardia with 1 mm ST elevation V1  Assessment/Plan Active Problems:   Alcohol abuse with intoxication delirium (HCC)   Increased anion gap metabolic acidosis   Hepatic steatosis   Mild intermittent asthma   Increased AG metabolic acidosis - secondary to alcohol intoxication and related vomiting. Patient given 2L bolus of IV NS. No ABG done. Also elevated CK total and lactic acid  Plan Continue vigorous IV hydration -   ABG, for pH <7.1 NaHCO3 replacement  Bmet in AM  2. Alcohol abuse with intoxication delirium - long term alcohol abuse, up to 1L /day of spirits. Reports she has been able to work. Father is alcoholic. She has never tried to stop drinking or attended AA.   Plan Alcohol withdrawal protocol - CIWA using IV lorazepam   TOC  Encouraged to stop drinking, attend AA  3. Hepatic steatosis - by U/S. Suspect alcohol related  Plan Low fat diet  Alcohol cessation  4. Asthma - stable. Continue home meds  DVT prophylaxis: lovenox  Code Status: full code  Family Communication: attempted to call Rogue Jury, mother, but no answer and no answering device  Disposition Plan: home when stable.  (specify when and where you expect patient to be discharged) Consults called: none  Admission status: inpatient/med-surg    Illene Regulus MD [facility] Pager [phone]  If 7PM-7AM, please contact night-coverage [email or web address] Password [facility]  10/13/2020, 10:26 AM

## 2020-10-13 NOTE — TOC Initial Note (Addendum)
Transition of Care [facility]) - Initial/Assessment Note    Patient Details  Name: Jordan Stevens MRN: [id number] Date of Birth: 09-Jul-1990  [facility] CM/SW Contact:    Jordan Stevens, [place] Phone Jordan Stevens 10/13/2020, 1:55 PM  Clinical Narrative:                 TOC consulted for substance abuse education and resources. Patient is a 30 year old female with history of asthma and alcohol abuse-multiple ED visits for alcoholic gastritis. Met with patient at bedside, patient admits to alcohol  use, (last use yesterday) and is agreeable to outpatient treatment stating that she would like to "get back to her old self". Patient lives alone with her 82 year old son and works as a Engineer, structural in [place]. Patient's mother, step father and father of her son are reported to be supportive and willing to assist with her care and care of her son.  Patient's 27 year old son is now with his father and will be staying with patient's mother and step-father during the week. Patient's motivation for treatment discussed, substance abuse resources provided. This Education officer, museum given permission to speak with patient's mother, the importance of treatment discussed as well as resources provided and need for patient's input regarding outpatient treatment.  Transition of Care to continue to follow to address discharge needs.   Jordan Tallo, LCSW Transition of Care [phone]   Expected Discharge Plan: Home/Self Care Barriers to Discharge: Continued Medical Work up   Patient Goals and CMS Choice Patient states their goals for this hospitalization and ongoing recovery are:: "I wan to get back to myself"      Expected Discharge Plan and Services Expected Discharge Plan: Home/Self Care In-house Referral: Jordan Stevens     Living arrangements for the past 2 months:  ([place])                                      Prior Living Arrangements/Services Living arrangements for the past  2 months:  ([place]) Lives with:: Minor Children Patient language and need for interpreter reviewed:: Yes Do you feel safe going back to the place where you live?: Yes        Care giver support system in place?: Yes (comment)   Criminal Activity/Legal Involvement Pertinent to Current Situation/Hospitalization: No - Comment as needed  Activities of Daily Living      Permission Sought/Granted            Permission granted to share info w Relationship: Jordan Stevens  Permission granted to share info w Contact Information: [phone]  Emotional Assessment Appearance:: Appears stated age Attitude/Demeanor/Rapport: Engaged Affect (typically observed): Adaptable, Accepting Orientation: : Oriented to Self, Oriented to Place, Oriented to  Time, Oriented to Situation Alcohol / Substance Use: Alcohol Use Psych Involvement: No (comment)  Admission diagnosis:  Alcohol intoxication delirium ([place]) [F10.121] Patient Active Problem List   Diagnosis Date Noted   Alcohol abuse with intoxication delirium ([place]) 10/13/2020   Increased anion gap metabolic acidosis 65/78/4696   Hepatic steatosis 10/13/2020   Mild intermittent asthma 05/21/2016   Abnormal hemoglobin ([place]) 11/12/2014   PCP:  Jordan Patch, MD Pharmacy:   [id number] [address], [place] [address] Phone: [phone] Fax: [phone]     Social Determinants of Health (SDOH) Interventions    Readmission Risk Interventions  No flowsheet data found.

## 2020-10-13 NOTE — ED Triage Notes (Signed)
Pt to ED via POV c/o emesis. Pt reports that she feels like her heart is beating "really fast" and that she feels like she is going to pass out. Pt is not currently vomiting at this time. Pt is in NAD.

## 2020-10-13 NOTE — ED Notes (Signed)
US at bedside

## 2020-10-13 NOTE — ED Notes (Signed)
EDP at bedside  

## 2020-10-13 NOTE — ED Provider Notes (Signed)
[facility] Emergency Department Provider Note   ____________________________________________   Event Date/Time   First MD Initiated Contact with Patient 10/13/20 [phone]     (approximate)  I have reviewed the triage vital signs and the nursing notes.   HISTORY  Chief Complaint Emesis    HPI Jordan Stevens is a 30 y.o. female history of asthma alcohol abuse gastritis  Patient presents today, has been vomiting she reports with upset stomach for about 24 hours.  Not able to keep anything fluids or foods down  And she denies any fevers or chills.  Not really painful except for mild discomfort in her right upper abdomen mid abdomen.  No chest pain.  Passing gas.  Denies pregnancy.  She relates that symptoms seem similar to when she had a few weeks ago and was told she had upset stomach.  She does reports that she continues to drink alcohol quite heavily up to half gallon of hard liquor every 1 to 2 days.  This morning she started to see hallucinations she is seeing images of Jesus writing encouraging letters on the floor of the kitchen in the home.  Her mother who is with her is also concerned about this  No fevers or chills.  She has followed up with small black flecks but now when she is vomiting seems to be "green".  No black or bloody stools   Past Medical History:  Diagnosis Date   Asthma 30 years old    Patient Active Problem List   Diagnosis Date Noted   Alcohol abuse with intoxication delirium (HCC) 10/13/2020   Increased anion gap metabolic acidosis 10/13/2020   Alcohol intoxication delirium (HCC) 10/13/2020   Mild intermittent asthma 05/21/2016   Abnormal hemoglobin (HCC) 11/12/2014    Past Surgical History:  Procedure Laterality Date   LEG SURGERY Left 2010   TONSILLECTOMY      Prior to Admission medications   Medication Sig Start Date End Date Taking? Authorizing Provider  albuterol (PROVENTIL) (2.5 MG/3ML) 0.083% nebulizer solution  TAKE 3 MLS BY NEBULIZATION EVERY 6 HOURS AS NEEDED FOR WHEEZING OR SHORTNESS OF BREATH. 08/03/19   Duanne Limerick, MD  albuterol (VENTOLIN HFA) 108 (90 Base) MCG/ACT inhaler TAKE 2 PUFFS BY MOUTH EVERY 6 HOURS AS NEEDED FOR WHEEZE OR SHORTNESS OF BREATH 10/29/19   Duanne Limerick, MD  azithromycin (ZITHROMAX) 250 MG tablet 2 today then 1 a day for 4 days Patient not taking: Reported on 08/03/2019 12/21/18   Duanne Limerick, MD  famotidine (PEPCID) 40 MG tablet Take 1 tablet (40 mg total) by mouth every evening. 09/09/20 09/09/21  Merwyn Katos, MD  guaiFENesin-codeine (ROBITUSSIN AC) 100-10 MG/5ML syrup Take 5 mLs by mouth 4 (four) times daily as needed for cough. Patient not taking: Reported on 08/03/2019 12/21/18   Duanne Limerick, MD  montelukast (SINGULAIR) 10 MG tablet Take 1 tablet (10 mg total) by mouth at bedtime. 08/03/19   Duanne Limerick, MD  ondansetron (ZOFRAN ODT) 4 MG disintegrating tablet Take 1 tablet (4 mg total) by mouth every 8 (eight) hours as needed. 10/10/19   Jene Every, MD  ondansetron (ZOFRAN ODT) 4 MG disintegrating tablet Take 1 tablet (4 mg total) by mouth every 8 (eight) hours as needed for nausea or vomiting. 09/09/20   Merwyn Katos, MD  predniSONE (DELTASONE) 20 MG tablet Take 2 tablets (40 mg total) by mouth daily with breakfast. 08/05/19   Eyvonne Mechanic, PA-C    Allergies  Patient has no known allergies.  History reviewed. No pertinent family history.  Social History Social History   Tobacco Use   Smoking status: Former    Pack years: 0.00   Smokeless tobacco: Never  Substance Use Topics   Alcohol use: Yes   Drug use: No    Review of Systems Constitutional: No fever/chills Eyes: No visual changes. ENT: No sore throat. Cardiovascular: Denies chest pain. Respiratory: Denies shortness of breath. Gastrointestinal: See HPI Genitourinary: Negative for dysuria.  Denies pregnancy. Musculoskeletal: Negative for back pain. Skin: Negative for  rash. Neurological: Negative for headaches, areas of focal weakness or numbness. Psychiatric: No desire to hurt self or others but has had some hallucinations seeing encouraging letters written and times that do not quite seem right, saw Jesus scripting letters on the floor earlier today      ____________________________________________   PHYSICAL EXAM:  VITAL SIGNS: ED Triage Vitals  Enc Vitals Group     BP 10/13/20 0658 (!) 133/97     Pulse Rate 10/13/20 0658 (!) 138     Resp 10/13/20 0658 16     Temp 10/13/20 0658 (!) 97.5 F (36.4 C)     Temp Source 10/13/20 0658 Oral     SpO2 10/13/20 0658 98 %     Weight 10/13/20 0657 190 lb (86.2 kg)     Height 10/13/20 0657 5\' 4"  (1.626 m)     Head Circumference --      Peak Flow --      Pain Score 10/13/20 0657 8     Pain Loc --      Pain Edu? --      Excl. in GC? --     Constitutional: Alert and oriented.  Mildly ill-appearing somewhat tremulousness eyes: Conjunctivae are injected bilateral Head: Atraumatic. Nose: No congestion/rhinnorhea. Mouth/Throat: Mucous membranes are notably dry Neck: No stridor.  Cardiovascular: Tachycardic rate, regular rhythm. Grossly normal heart sounds.  Good peripheral circulation. Respiratory: Normal respiratory effort.  No retractions. Lungs CTAB. Gastrointestinal: Soft and nontender except some mild tenderness in the epigastrium and also in the right upper quadrant without rebound or guarding.  Lower quadrants nontender bilateral.. No distention. Musculoskeletal: No lower extremity tenderness nor edema. Neurologic:  Normal speech and language. No gross focal neurologic deficits are appreciated.  Skin:  Skin is warm, dry and intact. No rash noted. Psychiatric: Mood and affect are normal. Speech and behavior are normal.  Denies active hallucinations except she does report at one point she saw writing on the wall of the room but did not quite seem to be  real.  ____________________________________________   LABS (all labs ordered are listed, but only abnormal results are displayed)  Labs Reviewed  LIPASE, BLOOD - Abnormal; Notable for the following components:      Result Value   Lipase 77 (*)    All other components within normal limits  COMPREHENSIVE METABOLIC PANEL - Abnormal; Notable for the following components:   Sodium 133 (*)    Potassium 3.2 (*)    Chloride 93 (*)    CO2 17 (*)    Glucose, Bld 210 (*)    BUN 23 (*)    Creatinine, Ser 1.11 (*)    Total Protein 9.0 (*)    AST 68 (*)    ALT 72 (*)    Total Bilirubin 1.8 (*)    Anion gap 23 (*)    All other components within normal limits  CBC - Abnormal; Notable for the following components:  RBC 5.68 (*)    MCV 70.6 (*)    MCH 24.6 (*)    RDW 19.1 (*)    All other components within normal limits  LACTIC ACID, PLASMA - Abnormal; Notable for the following components:   Lactic Acid, Venous 3.6 (*)    All other components within normal limits  CK - Abnormal; Notable for the following components:   Total CK 384 (*)    All other components within normal limits  BETA-HYDROXYBUTYRIC ACID - Abnormal; Notable for the following components:   Beta-Hydroxybutyric Acid 1.94 (*)    All other components within normal limits  ETHANOL - Abnormal; Notable for the following components:   Alcohol, Ethyl (B) 47 (*)    All other components within normal limits  MAGNESIUM - Abnormal; Notable for the following components:   Magnesium 1.6 (*)    All other components within normal limits  RESP PANEL BY RT-PCR (FLU A&B, COVID) ARPGX2  TSH  PROTIME-INR  URINALYSIS, COMPLETE (UACMP) WITH MICROSCOPIC  POC URINE PREG, ED  POC URINE PREG, ED   ____________________________________________  EKG  Reviewed interim IV at 75 Heart rate 145 QRS 129 QTc 520 Sinus tachycardia, mild nonspecific ST segment abnormality. ____________________________________________  RADIOLOGY  US ABDOMEN  LIMITED RUQ (LIVER/GB)  Result Date: 10/13/2020 CLINICAL DATA:  Abdominal pain EXAM: ULTRASOUND ABDOMEN LIMITED RIGHT UPPER QUADRANT COMPARISON:  None. FINDINGS: Gallbladder: No gallstones or wall thickening visualized. No sonographic Murphy sign noted by sonographer. Common bile duct: Diameter: 3 mm Liver: Echogenic liver with diminished acoustic penetration. No focal lesion. Portal vein is patent on color Doppler imaging with normal direction of blood flow towards the liver. Other: Borderline increased cortical echogenicity of the visualized right kidney with prominent corticomedullary differentiation. IMPRESSION: 1. Negative gallbladder. 2. Hepatic steatosis. Electronically Signed   By: Marnee Spring M.D.   On: 10/13/2020 09:24    ----------------------------------------- 11:34 AM on 10/13/2020 ----------------------------------------- Initially ordered 2 view abdomen as well to evaluate for obstructive pathology but given the patient's laboratory findings response to therapy and improvement and reassuring abdominal exam at this time we will discontinue.  Clinically doubt obstruction ____________________________________________   PROCEDURES  Procedure(s) performed: None  Procedures  Critical Care performed: Yes, see critical care note(s)  CRITICAL CARE Performed by: Sharyn Creamer   Total critical care time: 35 minutes  Critical care time was exclusive of separately billable procedures and treating other patients.  Critical care was necessary to treat or prevent imminent or life-threatening deterioration.  Critical care was time spent personally by me on the following activities: development of treatment plan with patient and/or surrogate as well as nursing, discussions with consultants, evaluation of patient's response to treatment, examination of patient, obtaining history from patient or surrogate, ordering and performing treatments and interventions, ordering and review of  laboratory studies, ordering and review of radiographic studies, pulse oximetry and re-evaluation of patient's condition.  ____________________________________________   INITIAL IMPRESSION / ASSESSMENT AND PLAN / ED COURSE  Pertinent labs & imaging results that were available during my care of the patient were reviewed by me and considered in my medical decision making (see chart for details).   Patient presents for vomiting.  This is in the setting of ongoing alcohol abuse and recent gastritis suspected alcohol abuse.  Today she appears quite dry dehydrated I suspect also likely an acute withdrawal with some delirium started to have some hallucinations associated with not having alcohol for 24 hours  She appears moderately ill.  Initiate withdrawal protocol.  IV Ativan, fluids, IV vitamins banana bag ordered.  Zofran ordered for antiemetic.  Magnesium ordered empirically  Abdominal exam quite reassuring except for some focal discomfort in right upper quadrant epigastrium.  Low suspicion without fever or leukocytosis for acute infectious etiology.  She does report some small black specks of blood in her what I suspect to be blood in her emesis 24 hours ago, and suspect associated gastritis once again.  ----------------------------------------- 9:36 AM on 10/13/2020 ----------------------------------------- Clinical history and lab work and also concerning now for alcoholic ketoacidosis.  She has mildly elevated lactic acid elevated ketones, elevated anion gap in the setting of cessation of heavy alcohol use I suspect likely related to some sort of alcohol gastritis or perhaps even early alcoholic hepatitis though this seems less likely  Patient being admitted to the hospital service for further work-up and care under the hospitalist service Dr. Debby BudNorins.  Dr. Debby BudNorins received report in the ER for me on the patient.  Patient is improving at this point with regard to heart rate vital signs etc.  with current treatments.  D5NS added      ____________________________________________   FINAL CLINICAL IMPRESSION(S) / ED DIAGNOSES  Final diagnoses:  Abdominal pain  Alcoholic ketoacidosis  Delirium, withdrawal, alcoholic (HCC)        Note:  This document was prepared using Dragon voice recognition software and may include unintentional dictation errors       Sharyn CreamerQuale, Ankur Snowdon, MD 10/13/20 1135

## 2020-10-13 NOTE — ED Triage Notes (Signed)
Pt mom here voicing concerns about pts alcohol abuse. Mom requesting pt get help from the abuse. Mom reports pt was hallucinating and has had severe depression and anxiety attacks.

## 2020-10-14 LAB — PROTIME-INR
INR: 1 (ref 0.8–1.2)
Prothrombin Time: 13.5 seconds (ref 11.4–15.2)

## 2020-10-14 LAB — COMPREHENSIVE METABOLIC PANEL
ALT: 43 U/L (ref 0–44)
AST: 43 U/L — ABNORMAL HIGH (ref 15–41)
Albumin: 3.6 g/dL (ref 3.5–5.0)
Alkaline Phosphatase: 40 U/L (ref 38–126)
Anion gap: 5 (ref 5–15)
BUN: 11 mg/dL (ref 6–20)
CO2: 26 mmol/L (ref 22–32)
Calcium: 8.9 mg/dL (ref 8.9–10.3)
Chloride: 108 mmol/L (ref 98–111)
Creatinine, Ser: 0.67 mg/dL (ref 0.44–1.00)
GFR, Estimated: >60 mL/min (ref >60)
Glucose, Bld: 104 mg/dL — ABNORMAL HIGH (ref 70–99)
Potassium: 3.2 mmol/L — ABNORMAL LOW (ref 3.5–5.1)
Sodium: 139 mmol/L (ref 135–145)
Total Bilirubin: 2.1 mg/dL — ABNORMAL HIGH (ref 0.3–1.2)
Total Protein: 6.7 g/dL (ref 6.5–8.1)

## 2020-10-14 LAB — LACTIC ACID, PLASMA
Lactic Acid, Venous: 0.8 mmol/L (ref 0.5–1.9)
Lactic Acid, Venous: 1.2 mmol/L (ref 0.5–1.9)

## 2020-10-14 LAB — HIV ANTIBODY (ROUTINE TESTING W REFLEX): HIV Screen 4th Generation wRfx: NONREACTIVE

## 2020-10-14 LAB — MAGNESIUM: Magnesium: 2 mg/dL (ref 1.7–2.4)

## 2020-10-14 MED ORDER — DM-GUAIFENESIN ER 30-600 MG PO TB12
1.0000 | ORAL_TABLET | Freq: Two times a day (BID) | ORAL | Status: DC
  Administered 2020-10-14 – 2020-10-16 (×5): 1 via ORAL
  Filled 2020-10-14 (×4): qty 1

## 2020-10-14 MED ORDER — ALBUTEROL SULFATE HFA 108 (90 BASE) MCG/ACT IN AERS: 2.0000 | INHALATION_SPRAY | Freq: Four times a day (QID) | RESPIRATORY_TRACT | Status: DC | PRN

## 2020-10-14 MED ORDER — ENOXAPARIN SODIUM 60 MG/0.6ML IJ SOSY
0.5000 mg/kg | PREFILLED_SYRINGE | INTRAMUSCULAR | Status: DC
  Filled 2020-10-14 (×2): qty 0.6

## 2020-10-14 MED ORDER — POTASSIUM CHLORIDE CRYS ER 20 MEQ PO TBCR
40.0000 meq | EXTENDED_RELEASE_TABLET | Freq: Once | ORAL | Status: AC
  Administered 2020-10-14: 40 meq via ORAL
  Filled 2020-10-14: qty 2

## 2020-10-14 NOTE — Progress Notes (Signed)
PROGRESS NOTE  Jordan Stevens TDV:[id number] DOB: Nov 02, 1990 DOA: 10/13/2020 PCP: Duanne Limerick, MD  HPI/Recap of past 24 hours: Jordan Stevens is a 30 y.o. female with medical history significant of asthma and alcohol abuse with multiple ED visits (10/09/19, 05/10/20, 09/09/20) for alcoholic gastritis. She continues to drink greater that 750 ml daily of spirits. On the day of presentation she was having abdominal pain and also marked hallucinosis. She presents to [facility] ED for evaluation. In the ED, T 97.5  144/103  HR 122  RR 20. Lab reveals glucose 210 , Chloride 93(l), Co2 17 (l), AG 23, Mg 1.6, CK total 384, Lactic acid 3.6, lipase 77  AST 68, ALT 72, CBC nl. In ED she was given 2L NS bolus, IV thiamin, IV Mg, IV K. Maintenance IVF at 250cc/hr running. Abd U/S showed negative gallbladder, hepatic steatosis. TRH called to admit for alcohol related ketoacidosis and alcohol withdrawal management.    Today, patient reports feeling very tired, with some headache and occasional non-productive cough.  Still intermittently confused.  Mother at bedside who expressed frustration, but supportive.  Assessment/Plan: Active Problems:   Mild intermittent asthma   Alcohol abuse with intoxication delirium (HCC)   Increased anion gap metabolic acidosis   Hepatic steatosis   Alcohol intoxication/abuse and withdrawal Drinks up to 1 L of spirits per day Continue IV hydration CIWA protocol Advised to quit, Child psychotherapist consulted, recommended outpatient treatment Monitor closely  Transaminitis Likely 2/2 above History of hepatic steatosis Trend CMP  Hypokalemia Replace as needed  History of asthma Mild intermittent asthma Continue duo nebs as needed, inhalers as needed, cough suppressants, singular Supplemental O2 as needed  Tobacco abuse Smokes about half a pack a day Advised to quit  Obesity Lifestyle modification advised    Estimated body mass index is 32.61 kg/m as calculated from the  following:   Height as of this encounter: 5\' 4"  (1.626 m).   Weight as of this encounter: 86.2 kg.     Code Status: Full  Family Communication: Discussed with mother at bedside  Disposition Plan: Status is: Inpatient  Remains inpatient appropriate because:Inpatient level of care appropriate due to severity of illness  Dispo: The patient is from: Home              Anticipated d/c is to: Home              Patient currently is not medically stable to d/c.   Difficult to place patient No   Consultants: None  Procedures: None  Antimicrobials: None  DVT prophylaxis: Lovenox   Objective: Vitals:   10/13/20 1824 10/13/20 1954 10/14/20 0525 10/14/20 0727  BP: 128/79 (!) 142/99 123/85 (!) 136/95  Pulse: 88 (!) 109 76 (!) 101  Resp: 17 16 16 16   Temp: 98.3 F (36.8 C) 98.3 F (36.8 C) 98.8 F (37.1 C) 98.1 F (36.7 C)  TempSrc:  Oral Oral Oral  SpO2: 100% 100% 100% 98%  Weight:      Height:        Intake/Output Summary (Last 24 hours) at 10/14/2020 1440 Last data filed at 10/14/2020 0549 Gross per 24 hour  Intake 549.07 ml  Output 0 ml  Net 549.07 ml   Filed Weights   10/13/20 0657  Weight: 86.2 kg    Exam: General: NAD, sleepy Cardiovascular: S1, S2 present Respiratory: Diminished breath sounds bilaterally Abdomen: Soft, nontender, nondistended, bowel sounds present Musculoskeletal: No bilateral pedal edema noted Skin: Normal Psychiatry: Fair mood  Data Reviewed: CBC: Recent Labs  Lab 10/13/20 0701  WBC 5.8  HGB 14.0  HCT 40.1  MCV 70.6*  PLT 367   Basic Metabolic Panel: Recent Labs  Lab 10/13/20 0701 10/13/20 0827 10/14/20 0604  NA 133*  --  139  K 3.2*  --  3.2*  CL 93*  --  108  CO2 17*  --  26  GLUCOSE 210*  --  104*  BUN 23*  --  11  CREATININE 1.11*  --  0.67  CALCIUM 9.4  --  8.9  MG  --  1.6* 2.0   GFR: Estimated Creatinine Clearance: 110.2 mL/min (by C-G formula based on SCr of 0.67 mg/dL). Liver Function  Tests: Recent Labs  Lab 10/13/20 0701 10/14/20 0604  AST 68* 43*  ALT 72* 43  ALKPHOS 60 40  BILITOT 1.8* 2.1*  PROT 9.0* 6.7  ALBUMIN 4.7 3.6   Recent Labs  Lab 10/13/20 0701  LIPASE 77*   No results for input(s): AMMONIA in the last 168 hours. Coagulation Profile: Recent Labs  Lab 10/13/20 0935 10/14/20 0604  INR 1.1 1.0   Cardiac Enzymes: Recent Labs  Lab 10/13/20 0827  CKTOTAL 384*   BNP (last 3 results) No results for input(s): PROBNP in the last 8760 hours. HbA1C: No results for input(s): HGBA1C in the last 72 hours. CBG: No results for input(s): GLUCAP in the last 168 hours. Lipid Profile: No results for input(s): CHOL, HDL, LDLCALC, TRIG, CHOLHDL, LDLDIRECT in the last 72 hours. Thyroid Function Tests: Recent Labs    10/13/20 0827  TSH 3.226   Anemia Panel: No results for input(s): VITAMINB12, FOLATE, FERRITIN, TIBC, IRON, RETICCTPCT in the last 72 hours. Urine analysis:    Component Value Date/Time   COLORURINE YELLOW (A) 10/13/2020 1530   APPEARANCEUR CLOUDY (A) 10/13/2020 1530   APPEARANCEUR Hazy 01/21/2013 1604   LABSPEC 1.029 10/13/2020 1530   LABSPEC 1.027 01/21/2013 1604   PHURINE 6.0 10/13/2020 1530   GLUCOSEU NEGATIVE 10/13/2020 1530   GLUCOSEU Negative 01/21/2013 1604   HGBUR MODERATE (A) 10/13/2020 1530   BILIRUBINUR NEGATIVE 10/13/2020 1530   BILIRUBINUR Negative 01/21/2013 1604   KETONESUR 80 (A) 10/13/2020 1530   PROTEINUR 100 (A) 10/13/2020 1530   NITRITE NEGATIVE 10/13/2020 1530   LEUKOCYTESUR NEGATIVE 10/13/2020 1530   LEUKOCYTESUR 3+ 01/21/2013 1604   Sepsis Labs: @LABRCNTIP (procalcitonin:4,lacticidven:4)  ) Recent Results (from the past 240 hour(s))  Resp Panel by RT-PCR (Flu A&B, Covid) Nasopharyngeal Swab     Status: None   Collection Time: 10/13/20  8:15 AM   Specimen: Nasopharyngeal Swab; Nasopharyngeal(NP) swabs in vial transport medium  Result Value Ref Range Status   SARS Coronavirus 2 by RT PCR NEGATIVE  NEGATIVE Final    Comment: (NOTE) SARS-CoV-2 target nucleic acids are NOT DETECTED.  The SARS-CoV-2 RNA is generally detectable in upper respiratory specimens during the acute phase of infection. The lowest concentration of SARS-CoV-2 viral copies this assay can detect is 138 copies/mL. A negative result does not preclude SARS-Cov-2 infection and should not be used as the sole basis for treatment or other patient management decisions. A negative result may occur with  improper specimen collection/handling, submission of specimen other than nasopharyngeal swab, presence of viral mutation(s) within the areas targeted by this assay, and inadequate number of viral copies(<138 copies/mL). A negative result must be combined with clinical observations, patient history, and epidemiological information. The expected result is Negative.  Fact Sheet for Patients:  10/15/20  Fact Sheet for  Healthcare Providers:  SeriousBroker.it  This test is no t yet approved or cleared by the [place] and  has been authorized for detection and/or diagnosis of SARS-CoV-2 by FDA under an Emergency Use Authorization (EUA). This EUA will remain  in effect (meaning this test can be used) for the duration of the COVID-19 declaration under Section 564(b)(1) of the Act, 21 U.S.C.section 360bbb-3(b)(1), unless the authorization is terminated  or revoked sooner.       Influenza A by PCR NEGATIVE NEGATIVE Final   Influenza B by PCR NEGATIVE NEGATIVE Final    Comment: (NOTE) The Xpert Xpress SARS-CoV-2/FLU/RSV plus assay is intended as an aid in the diagnosis of influenza from Nasopharyngeal swab specimens and should not be used as a sole basis for treatment. Nasal washings and aspirates are unacceptable for Xpert Xpress SARS-CoV-2/FLU/RSV testing.  Fact Sheet for Patients: BloggerCourse.com  Fact Sheet for Healthcare  Providers: SeriousBroker.it  This test is not yet approved or cleared by the [place] FDA and has been authorized for detection and/or diagnosis of SARS-CoV-2 by FDA under an Emergency Use Authorization (EUA). This EUA will remain in effect (meaning this test can be used) for the duration of the COVID-19 declaration under Section 564(b)(1) of the Act, 21 U.S.C. section 360bbb-3(b)(1), unless the authorization is terminated or revoked.  Performed at [facility], [address] [place], [place] [address]       Studies: No results found.  Scheduled Meds:  [START ON 10/15/2020] enoxaparin (LOVENOX) injection  0.5 mg/kg Subcutaneous Q24H   folic acid  1 mg Oral Daily   LORazepam  0-4 mg Intravenous Q6H   Followed by   Melene Muller ON 10/15/2020] LORazepam  0-4 mg Intravenous Q12H   montelukast  10 mg Oral QHS   multivitamin with minerals  1 tablet Oral Daily   pantoprazole  40 mg Oral Daily   senna  1 tablet Oral BID    Continuous Infusions:  dextrose 5 % and 0.9 % NaCl with KCl 20 mEq/L 75 mL/hr at 10/14/20 0921     LOS: 1 day     Briant Cedar, MD [facility]  If 7PM-7AM, please contact night-coverage [email or web address] 10/14/2020, 2:40 PM

## 2020-10-15 LAB — CBC WITH DIFFERENTIAL/PLATELET
Abs Immature Granulocytes: 0 10*3/uL (ref 0.00–0.07)
Basophils Absolute: 0 10*3/uL (ref 0.0–0.1)
Basophils Relative: 1 %
Eosinophils Absolute: 0.3 10*3/uL (ref 0.0–0.5)
Eosinophils Relative: 9 %
HCT: 31 % — ABNORMAL LOW (ref 36.0–46.0)
Hemoglobin: 10.3 g/dL — ABNORMAL LOW (ref 12.0–15.0)
Immature Granulocytes: 0 %
Lymphocytes Relative: 45 %
Lymphs Abs: 1.8 10*3/uL (ref 0.7–4.0)
MCH: 24.4 pg — ABNORMAL LOW (ref 26.0–34.0)
MCHC: 33.2 g/dL (ref 30.0–36.0)
MCV: 73.5 fL — ABNORMAL LOW (ref 80.0–100.0)
Monocytes Absolute: 0.4 10*3/uL (ref 0.1–1.0)
Monocytes Relative: 9 %
Neutro Abs: 1.4 10*3/uL — ABNORMAL LOW (ref 1.7–7.7)
Neutrophils Relative %: 36 %
Platelets: 165 10*3/uL (ref 150–400)
RBC: 4.22 MIL/uL (ref 3.87–5.11)
RDW: 17.9 % — ABNORMAL HIGH (ref 11.5–15.5)
WBC: 3.9 10*3/uL — ABNORMAL LOW (ref 4.0–10.5)
nRBC: 0 % (ref 0.0–0.2)

## 2020-10-15 LAB — COMPREHENSIVE METABOLIC PANEL
ALT: 41 U/L (ref 0–44)
AST: 42 U/L — ABNORMAL HIGH (ref 15–41)
Albumin: 3.3 g/dL — ABNORMAL LOW (ref 3.5–5.0)
Alkaline Phosphatase: 43 U/L (ref 38–126)
Anion gap: 5 (ref 5–15)
BUN: 9 mg/dL (ref 6–20)
CO2: 25 mmol/L (ref 22–32)
Calcium: 8.5 mg/dL — ABNORMAL LOW (ref 8.9–10.3)
Chloride: 106 mmol/L (ref 98–111)
Creatinine, Ser: 0.52 mg/dL (ref 0.44–1.00)
GFR, Estimated: >60 mL/min (ref >60)
Glucose, Bld: 96 mg/dL (ref 70–99)
Potassium: 3.1 mmol/L — ABNORMAL LOW (ref 3.5–5.1)
Sodium: 136 mmol/L (ref 135–145)
Total Bilirubin: 1.3 mg/dL — ABNORMAL HIGH (ref 0.3–1.2)
Total Protein: 6.1 g/dL — ABNORMAL LOW (ref 6.5–8.1)

## 2020-10-15 LAB — MAGNESIUM: Magnesium: 1.7 mg/dL (ref 1.7–2.4)

## 2020-10-15 MED ORDER — POTASSIUM CHLORIDE 20 MEQ PO PACK
40.0000 meq | PACK | Freq: Two times a day (BID) | ORAL | Status: DC
  Administered 2020-10-15 – 2020-10-16 (×3): 40 meq via ORAL
  Filled 2020-10-15 (×3): qty 2

## 2020-10-15 NOTE — Progress Notes (Signed)
PROGRESS NOTE  Jordan Stevens DUK:[id number] DOB: 11-04-1990 DOA: 10/13/2020 PCP: Duanne Limerick, MD  HPI/Recap of past 24 hours: Jordan Stevens is a 30 y.o. female with medical history significant of asthma and alcohol abuse with multiple ED visits (10/09/19, 05/10/20, 09/09/20) for alcoholic gastritis. She continues to drink greater that 750 ml daily of spirits. On the day of presentation she was having abdominal pain and also marked hallucinosis. She presents to [facility] ED for evaluation. In the ED, T 97.5  144/103  HR 122  RR 20. Lab reveals glucose 210 , Chloride 93(l), Co2 17 (l), AG 23, Mg 1.6, CK total 384, Lactic acid 3.6, lipase 77  AST 68, ALT 72, CBC nl. In ED she was given 2L NS bolus, IV thiamin, IV Mg, IV K. Maintenance IVF at 250cc/hr running. Abd U/S showed negative gallbladder, hepatic steatosis. TRH called to admit for alcohol related ketoacidosis and alcohol withdrawal management.    Today, patient still feeling tired, confusion seems to be improving.  Does not appear to be hallucinating anymore.  Patient denies any chest pain, shortness of breath, abdominal pain, nausea/vomiting, fever/chills.    Assessment/Plan: Active Problems:   Mild intermittent asthma   Alcohol abuse with intoxication delirium (HCC)   Increased anion gap metabolic acidosis   Hepatic steatosis   Alcohol intoxication/abuse and withdrawal Drinks up to 1 L of spirits per day Continue IV hydration CIWA protocol Advised to quit, Child psychotherapist consulted, recommended outpatient treatment Monitor closely  Transaminitis Likely 2/2 above History of hepatic steatosis Trend CMP  Hypokalemia Replace as needed  History of asthma Mild intermittent asthma Continue duo nebs as needed, inhalers as needed, cough suppressants, singular Supplemental O2 as needed  Tobacco abuse Smokes about half a pack a day Advised to quit  Obesity Lifestyle modification advised    Estimated body mass index is 32.61 kg/m  as calculated from the following:   Height as of this encounter: 5\' 4"  (1.626 m).   Weight as of this encounter: 86.2 kg.     Code Status: Full  Family Communication: Discussed with mother at bedside  Disposition Plan: Status is: Inpatient  Remains inpatient appropriate because:Inpatient level of care appropriate due to severity of illness  Dispo: The patient is from: Home              Anticipated d/c is to: Home              Patient currently is not medically stable to d/c.   Difficult to place patient No   Consultants: None  Procedures: None  Antimicrobials: None  DVT prophylaxis: Lovenox   Objective: Vitals:   10/14/20 2021 10/15/20 0423 10/15/20 0804 10/15/20 1536  BP: 120/81 137/90 129/78 123/89  Pulse: 78 85 77 82  Resp: 20 20 18 18   Temp: 98.8 F (37.1 C) 98.7 F (37.1 C) 98 F (36.7 C) 98.1 F (36.7 C)  TempSrc: Oral Oral Oral   SpO2: 100% 100% 98% 100%  Weight:      Height:        Intake/Output Summary (Last 24 hours) at 10/15/2020 1701 Last data filed at 10/15/2020 0101 Gross per 24 hour  Intake 0 ml  Output --  Net 0 ml   Filed Weights   10/13/20 0657  Weight: 86.2 kg    Exam: General: NAD, sleepy Cardiovascular: S1, S2 present Respiratory: Diminished breath sounds bilaterally Abdomen: Soft, nontender, nondistended, bowel sounds present Musculoskeletal: No bilateral pedal edema noted Skin: Normal Psychiatry: Fair  mood    Data Reviewed: CBC: Recent Labs  Lab 10/13/20 0701 10/15/20 0448  WBC 5.8 3.9*  NEUTROABS  --  1.4*  HGB 14.0 10.3*  HCT 40.1 31.0*  MCV 70.6* 73.5*  PLT 367 165   Basic Metabolic Panel: Recent Labs  Lab 10/13/20 0701 10/13/20 0827 10/14/20 0604 10/15/20 0448  NA 133*  --  139 136  K 3.2*  --  3.2* 3.1*  CL 93*  --  108 106  CO2 17*  --  26 25  GLUCOSE 210*  --  104* 96  BUN 23*  --  11 9  CREATININE 1.11*  --  0.67 0.52  CALCIUM 9.4  --  8.9 8.5*  MG  --  1.6* 2.0 1.7   GFR: Estimated  Creatinine Clearance: 109.2 mL/min (by C-G formula based on SCr of 0.52 mg/dL). Liver Function Tests: Recent Labs  Lab 10/13/20 0701 10/14/20 0604 10/15/20 0448  AST 68* 43* 42*  ALT 72* 43 41  ALKPHOS 60 40 43  BILITOT 1.8* 2.1* 1.3*  PROT 9.0* 6.7 6.1*  ALBUMIN 4.7 3.6 3.3*   Recent Labs  Lab 10/13/20 0701  LIPASE 77*   No results for input(s): AMMONIA in the last 168 hours. Coagulation Profile: Recent Labs  Lab 10/13/20 0935 10/14/20 0604  INR 1.1 1.0   Cardiac Enzymes: Recent Labs  Lab 10/13/20 0827  CKTOTAL 384*   BNP (last 3 results) No results for input(s): PROBNP in the last 8760 hours. HbA1C: No results for input(s): HGBA1C in the last 72 hours. CBG: No results for input(s): GLUCAP in the last 168 hours. Lipid Profile: No results for input(s): CHOL, HDL, LDLCALC, TRIG, CHOLHDL, LDLDIRECT in the last 72 hours. Thyroid Function Tests: Recent Labs    10/13/20 0827  TSH 3.226   Anemia Panel: No results for input(s): VITAMINB12, FOLATE, FERRITIN, TIBC, IRON, RETICCTPCT in the last 72 hours. Urine analysis:    Component Value Date/Time   COLORURINE YELLOW (A) 10/13/2020 1530   APPEARANCEUR CLOUDY (A) 10/13/2020 1530   APPEARANCEUR Hazy 01/21/2013 1604   LABSPEC 1.029 10/13/2020 1530   LABSPEC 1.027 01/21/2013 1604   PHURINE 6.0 10/13/2020 1530   GLUCOSEU NEGATIVE 10/13/2020 1530   GLUCOSEU Negative 01/21/2013 1604   HGBUR MODERATE (A) 10/13/2020 1530   BILIRUBINUR NEGATIVE 10/13/2020 1530   BILIRUBINUR Negative 01/21/2013 1604   KETONESUR 80 (A) 10/13/2020 1530   PROTEINUR 100 (A) 10/13/2020 1530   NITRITE NEGATIVE 10/13/2020 1530   LEUKOCYTESUR NEGATIVE 10/13/2020 1530   LEUKOCYTESUR 3+ 01/21/2013 1604   Sepsis Labs: @LABRCNTIP (procalcitonin:4,lacticidven:4)  ) Recent Results (from the past 240 hour(s))  Resp Panel by RT-PCR (Flu A&B, Covid) Nasopharyngeal Swab     Status: None   Collection Time: 10/13/20  8:15 AM   Specimen:  Nasopharyngeal Swab; Nasopharyngeal(NP) swabs in vial transport medium  Result Value Ref Range Status   SARS Coronavirus 2 by RT PCR NEGATIVE NEGATIVE Final    Comment: (NOTE) SARS-CoV-2 target nucleic acids are NOT DETECTED.  The SARS-CoV-2 RNA is generally detectable in upper respiratory specimens during the acute phase of infection. The lowest concentration of SARS-CoV-2 viral copies this assay can detect is 138 copies/mL. A negative result does not preclude SARS-Cov-2 infection and should not be used as the sole basis for treatment or other patient management decisions. A negative result may occur with  improper specimen collection/handling, submission of specimen other than nasopharyngeal swab, presence of viral mutation(s) within the areas targeted by this assay, and  inadequate number of viral copies(<138 copies/mL). A negative result must be combined with clinical observations, patient history, and epidemiological information. The expected result is Negative.  Fact Sheet for Patients:  BloggerCourse.com  Fact Sheet for Healthcare Providers:  SeriousBroker.it  This test is no t yet approved or cleared by the [place] FDA and  has been authorized for detection and/or diagnosis of SARS-CoV-2 by FDA under an Emergency Use Authorization (EUA). This EUA will remain  in effect (meaning this test can be used) for the duration of the COVID-19 declaration under Section 564(b)(1) of the Act, 21 U.S.C.section 360bbb-3(b)(1), unless the authorization is terminated  or revoked sooner.       Influenza A by PCR NEGATIVE NEGATIVE Final   Influenza B by PCR NEGATIVE NEGATIVE Final    Comment: (NOTE) The Xpert Xpress SARS-CoV-2/FLU/RSV plus assay is intended as an aid in the diagnosis of influenza from Nasopharyngeal swab specimens and should not be used as a sole basis for treatment. Nasal washings and aspirates are unacceptable for  Xpert Xpress SARS-CoV-2/FLU/RSV testing.  Fact Sheet for Patients: BloggerCourse.com  Fact Sheet for Healthcare Providers: SeriousBroker.it  This test is not yet approved or cleared by the [place] FDA and has been authorized for detection and/or diagnosis of SARS-CoV-2 by FDA under an Emergency Use Authorization (EUA). This EUA will remain in effect (meaning this test can be used) for the duration of the COVID-19 declaration under Section 564(b)(1) of the Act, 21 U.S.C. section 360bbb-3(b)(1), unless the authorization is terminated or revoked.  Performed at [facility], [address] [place], [place] [address]       Studies: No results found.  Scheduled Meds:  dextromethorphan-guaiFENesin  1 tablet Oral BID   enoxaparin (LOVENOX) injection  0.5 mg/kg Subcutaneous Q24H   folic acid  1 mg Oral Daily   montelukast  10 mg Oral QHS   multivitamin with minerals  1 tablet Oral Daily   pantoprazole  40 mg Oral Daily   potassium chloride  40 mEq Oral BID   senna  1 tablet Oral BID    Continuous Infusions:  dextrose 5 % and 0.9 % NaCl with KCl 20 mEq/L 75 mL/hr at 10/15/20 2595     LOS: 2 days     Briant Cedar, MD [facility]  If 7PM-7AM, please contact night-coverage [email or web address] 10/15/2020, 5:01 PM

## 2020-10-16 LAB — CBC WITH DIFFERENTIAL/PLATELET
Abs Immature Granulocytes: 0.01 10*3/uL (ref 0.00–0.07)
Basophils Absolute: 0 10*3/uL (ref 0.0–0.1)
Basophils Relative: 0 %
Eosinophils Absolute: 0.4 10*3/uL (ref 0.0–0.5)
Eosinophils Relative: 10 %
HCT: 32.6 % — ABNORMAL LOW (ref 36.0–46.0)
Hemoglobin: 10.6 g/dL — ABNORMAL LOW (ref 12.0–15.0)
Immature Granulocytes: 0 %
Lymphocytes Relative: 43 %
Lymphs Abs: 1.8 10*3/uL (ref 0.7–4.0)
MCH: 24.4 pg — ABNORMAL LOW (ref 26.0–34.0)
MCHC: 32.5 g/dL (ref 30.0–36.0)
MCV: 75.1 fL — ABNORMAL LOW (ref 80.0–100.0)
Monocytes Absolute: 0.2 10*3/uL (ref 0.1–1.0)
Monocytes Relative: 6 %
Neutro Abs: 1.7 10*3/uL (ref 1.7–7.7)
Neutrophils Relative %: 41 %
Platelets: 167 10*3/uL (ref 150–400)
RBC: 4.34 MIL/uL (ref 3.87–5.11)
RDW: 17.6 % — ABNORMAL HIGH (ref 11.5–15.5)
WBC: 4.1 10*3/uL (ref 4.0–10.5)
nRBC: 0 % (ref 0.0–0.2)

## 2020-10-16 LAB — COMPREHENSIVE METABOLIC PANEL
ALT: 65 U/L — ABNORMAL HIGH (ref 0–44)
AST: 75 U/L — ABNORMAL HIGH (ref 15–41)
Albumin: 3.4 g/dL — ABNORMAL LOW (ref 3.5–5.0)
Alkaline Phosphatase: 47 U/L (ref 38–126)
Anion gap: 7 (ref 5–15)
BUN: 5 mg/dL — ABNORMAL LOW (ref 6–20)
CO2: 25 mmol/L (ref 22–32)
Calcium: 9.3 mg/dL (ref 8.9–10.3)
Chloride: 105 mmol/L (ref 98–111)
Creatinine, Ser: 0.57 mg/dL (ref 0.44–1.00)
GFR, Estimated: >60 mL/min (ref >60)
Glucose, Bld: 106 mg/dL — ABNORMAL HIGH (ref 70–99)
Potassium: 3.6 mmol/L (ref 3.5–5.1)
Sodium: 137 mmol/L (ref 135–145)
Total Bilirubin: 1.1 mg/dL (ref 0.3–1.2)
Total Protein: 6.5 g/dL (ref 6.5–8.1)

## 2020-10-16 MED ORDER — FAMOTIDINE 20 MG PO TABS: 20.0000 mg | ORAL_TABLET | Freq: Every evening | ORAL | 3 refills | Status: AC

## 2020-10-16 NOTE — Progress Notes (Signed)
Physician Discharge Summary  Jordan Stevens DPO:[id number] DOB: 1990/08/31 DOA: 10/13/2020  PCP: Duanne Limerick, MD  Admit date: 10/13/2020 Discharge date: 10/16/2020  Admitted From: Home Discharge disposition: Home   Code Status: Full Code  Diet Recommendation: Regular diet  Discharge Diagnosis:   Active Problems:   Mild intermittent asthma   Alcohol abuse with intoxication delirium (HCC)   Increased anion gap metabolic acidosis   Hepatic steatosis  History of Present Illness / Brief narrative:  Jordan Stevens is a 30 y.o. female with medical history significant of asthma and alcohol abuse with multiple ED visits (10/09/19, 05/10/20, 09/09/20) for alcoholic gastritis. She continues to drink greater that 750 ml daily of spirits.  She presented to the ED on 6/26 with abdominal pain, marked hallucination. In the ED, patient was tachycardic Labs showed elevated blood close level, elevated lactic acid, low serum bicarbonate level. She was given IV hydration Admitted to hospital service for alcoholic ketoacidosis and impending withdrawal Clinically improved. Ready to go home today.    Subjective:  Seen and examined this morning.  Lying on bed.  Not in distress.  No hallucination, tremors. Continues to have mild headache and abdominal pain.  Hospital Course:  Alcohol intoxication Alcoholic ketoacidosis -Chronically drinks up to 1 L of spirits per day -Admitted with intoxication, alcoholic ketoacidosis -Clinically and metabolically improved with IV hydration. -CIWA protocol used to prevent withdrawal symptoms. -Currently stable enough to go home.  Counseled to quit alcohol.  Transaminitis/hepatic steatosis -Secondary to alcoholism.  Chronic alcoholic gastritis -No evidence of active bleeding.  Continue H2 blocker  Hypokalemia/hypomagnesemia -Improving with replacement. Recent Labs  Lab 10/13/20 0701 10/13/20 0827 10/14/20 0604 10/15/20 0448 10/16/20 0506  K 3.2*  --  3.2*  3.1* 3.6  MG  --  1.6* 2.0 1.7  --     History of asthma Mild intermittent asthma -Continue as needed inhalers   Tobacco abuse -Smokes about half a pack a day -Advised to quit   Obesity -Lifestyle modification advised  Wound care:    Discharge Exam:   Vitals:   10/15/20 1536 10/15/20 2011 10/16/20 0427 10/16/20 0758  BP: 123/89 130/88 114/74 115/79  Pulse: 82 85 98 83  Resp: 18 15  18   Temp: 98.1 F (36.7 C) 98.6 F (37 C) 98.2 F (36.8 C) 98.4 F (36.9 C)  TempSrc:  Oral Oral Oral  SpO2: 100% 100% 97% 100%  Weight:      Height:        Body mass index is 32.61 kg/m.  General exam: Pleasant, young African-American female.  Not in distress Skin: No rashes, lesions or ulcers. HEENT: Atraumatic, normocephalic, no obvious bleeding Lungs: Clear to auscultation bilaterally CVS: Regular rate and rhythm, no murmur GI/Abd soft, mild epigastric tenderness, nondistended CNS: Alert, awake, oriented x3 Psychiatry: Mood appropriate Extremities: No pedal edema, no calf tenderness  Follow ups:   Discharge Instructions     Diet general   Complete by: As directed    Increase activity slowly   Complete by: As directed        Follow-up Information     , MD Follow up.   Specialty: Family Medicine Contact information: [address] [place] [phone]                 Recommendations for Outpatient Follow-Up:   Follow-up with PCP as an outpatient  Discharge Instructions:  Follow with Primary MD [phone], MD in 7 days  Get CBC/BMP checked in next visit within 1 week by PCP or SNF MD ( we routinely change or add medications that can affect your baseline labs and fluid status, therefore we recommend that you get the mentioned basic workup next visit with your PCP, your PCP may decide not to get them or add new tests based on their clinical decision)  On your next visit with your PCP, please Get Medicines  reviewed and adjusted.  Please request your PCP  to go over all Hospital Tests and Procedure/Radiological results at the follow up, please get all Hospital records sent to your Prim MD by signing hospital release before you go home.  Activity: As tolerated with Full fall precautions use walker/cane & assistance as needed  For Heart failure patients - Check your Weight same time everyday, if you gain over 2 pounds, or you develop in leg swelling, experience more shortness of breath or chest pain, call your Primary MD immediately. Follow Cardiac Low Salt Diet and 1.5 lit/day fluid restriction.  If you have smoked or chewed Tobacco in the last 2 yrs please stop smoking, stop any regular Alcohol  and or any Recreational drug use.  If you experience worsening of your admission symptoms, develop shortness of breath, life threatening emergency, suicidal or homicidal thoughts you must seek medical attention immediately by calling 911 or calling your MD immediately  if symptoms less severe.  You Must read complete instructions/literature along with all the possible adverse reactions/side effects for all the Medicines you take and that have been prescribed to you. Take any new Medicines after you have completely understood and accpet all the possible adverse reactions/side effects.   Do not drive, operate heavy machinery, perform activities at heights, swimming or participation in water activities or provide baby sitting services if your were admitted for syncope or siezures until you have seen by Primary MD or a Neurologist and advised to do so again.  Do not drive when taking Pain medications.  Do not take more than prescribed Pain, Sleep and Anxiety Medications  Wear Seat belts while driving.   Please note You were cared for by a hospitalist during your hospital stay. If you have any questions about your discharge medications or the care you received while you were in the hospital after you are  discharged, you can call the unit and asked to speak with the hospitalist on call if the hospitalist that took care of you is not available. Once you are discharged, your primary care physician will handle any further medical issues. Please note that NO REFILLS for any discharge medications will be authorized once you are discharged, as it is imperative that you return to your primary care physician (or establish a relationship with a primary care physician if you do not have one) for your aftercare needs so that they can reassess your need for medications and monitor your lab values.    Allergies as of 10/16/2020   No Known Allergies      Medication List     STOP taking these medications    azithromycin 250 MG tablet Commonly known as: ZITHROMAX   guaiFENesin-codeine 100-10 MG/5ML syrup Commonly known as: ROBITUSSIN AC   montelukast 10 MG tablet Commonly known as: SINGULAIR   ondansetron 4 MG disintegrating tablet Commonly known as: Zofran ODT   predniSONE 20 MG tablet Commonly known as: DELTASONE       TAKE these medications    albuterol (2.5 MG/3ML) 0.083% nebulizer solution Commonly known as:  PROVENTIL TAKE 3 MLS BY NEBULIZATION EVERY 6 HOURS AS NEEDED FOR WHEEZING OR SHORTNESS OF BREATH.   albuterol 108 (90 Base) MCG/ACT inhaler Commonly known as: VENTOLIN HFA TAKE 2 PUFFS BY MOUTH EVERY 6 HOURS AS NEEDED FOR WHEEZE OR SHORTNESS OF BREATH   cetirizine 10 MG tablet Commonly known as: ZYRTEC Take 10 mg by mouth daily.   famotidine 20 MG tablet Commonly known as: PEPCID Take 1 tablet (20 mg total) by mouth every evening. What changed:  medication strength how much to take        Time coordinating discharge: 35 minutes  The results of significant diagnostics from this hospitalization (including imaging, microbiology, ancillary and laboratory) are listed below for reference.    Procedures and Diagnostic Studies:   US ABDOMEN LIMITED RUQ  (LIVER/GB)  Result Date: 10/13/2020 CLINICAL DATA:  Abdominal pain EXAM: ULTRASOUND ABDOMEN LIMITED RIGHT UPPER QUADRANT COMPARISON:  None. FINDINGS: Gallbladder: No gallstones or wall thickening visualized. No sonographic Murphy sign noted by sonographer. Common bile duct: Diameter: 3 mm Liver: Echogenic liver with diminished acoustic penetration. No focal lesion. Portal vein is patent on color Doppler imaging with normal direction of blood flow towards the liver. Other: Borderline increased cortical echogenicity of the visualized right kidney with prominent corticomedullary differentiation. IMPRESSION: 1. Negative gallbladder. 2. Hepatic steatosis. Electronically Signed   By: Marnee SpringJonathon  Watts M.D.   On: 10/13/2020 09:24     Labs:   Basic Metabolic Panel: Recent Labs  Lab 10/13/20 0701 10/13/20 0827 10/14/20 0604 10/15/20 0448 10/16/20 0506  NA 133*  --  139 136 137  K 3.2*  --  3.2* 3.1* 3.6  CL 93*  --  108 106 105  CO2 17*  --  26 25 25   GLUCOSE 210*  --  104* 96 106*  BUN 23*  --  11 9 5*  CREATININE 1.11*  --  0.67 0.52 0.57  CALCIUM 9.4  --  8.9 8.5* 9.3  MG  --  1.6* 2.0 1.7  --    GFR Estimated Creatinine Clearance: 109.2 mL/min (by C-G formula based on SCr of 0.57 mg/dL). Liver Function Tests: Recent Labs  Lab 10/13/20 0701 10/14/20 0604 10/15/20 0448 10/16/20 0506  AST 68* 43* 42* 75*  ALT 72* 43 41 65*  ALKPHOS 60 40 43 47  BILITOT 1.8* 2.1* 1.3* 1.1  PROT 9.0* 6.7 6.1* 6.5  ALBUMIN 4.7 3.6 3.3* 3.4*   Recent Labs  Lab 10/13/20 0701  LIPASE 77*   No results for input(s): AMMONIA in the last 168 hours. Coagulation profile Recent Labs  Lab 10/13/20 0935 10/14/20 0604  INR 1.1 1.0    CBC: Recent Labs  Lab 10/13/20 0701 10/15/20 0448 10/16/20 0506  WBC 5.8 3.9* 4.1  NEUTROABS  --  1.4* 1.7  HGB 14.0 10.3* 10.6*  HCT 40.1 31.0* 32.6*  MCV 70.6* 73.5* 75.1*  PLT 367 165 167   Cardiac Enzymes: Recent Labs  Lab 10/13/20 0827  CKTOTAL 384*    BNP: Invalid input(s): POCBNP CBG: No results for input(s): GLUCAP in the last 168 hours. D-Dimer No results for input(s): DDIMER in the last 72 hours. Hgb A1c No results for input(s): HGBA1C in the last 72 hours. Lipid Profile No results for input(s): CHOL, HDL, LDLCALC, TRIG, CHOLHDL, LDLDIRECT in the last 72 hours. Thyroid function studies No results for input(s): TSH, T4TOTAL, T3FREE, THYROIDAB in the last 72 hours.  Invalid input(s): FREET3 Anemia work up No results for input(s): VITAMINB12, FOLATE, FERRITIN, TIBC,  IRON, RETICCTPCT in the last 72 hours. Microbiology Recent Results (from the past 240 hour(s))  Resp Panel by RT-PCR (Flu A&B, Covid) Nasopharyngeal Swab     Status: None   Collection Time: 10/13/20  8:15 AM   Specimen: Nasopharyngeal Swab; Nasopharyngeal(NP) swabs in vial transport medium  Result Value Ref Range Status   SARS Coronavirus 2 by RT PCR NEGATIVE NEGATIVE Final    Comment: (NOTE) SARS-CoV-2 target nucleic acids are NOT DETECTED.  The SARS-CoV-2 RNA is generally detectable in upper respiratory specimens during the acute phase of infection. The lowest concentration of SARS-CoV-2 viral copies this assay can detect is 138 copies/mL. A negative result does not preclude SARS-Cov-2 infection and should not be used as the sole basis for treatment or other patient management decisions. A negative result may occur with  improper specimen collection/handling, submission of specimen other than nasopharyngeal swab, presence of viral mutation(s) within the areas targeted by this assay, and inadequate number of viral copies(<138 copies/mL). A negative result must be combined with clinical observations, patient history, and epidemiological information. The expected result is Negative.  Fact Sheet for Patients:  BloggerCourse.com  Fact Sheet for Healthcare Providers:  SeriousBroker.it  This test is no t yet  approved or cleared by the [place] FDA and  has been authorized for detection and/or diagnosis of SARS-CoV-2 by FDA under an Emergency Use Authorization (EUA). This EUA will remain  in effect (meaning this test can be used) for the duration of the COVID-19 declaration under Section 564(b)(1) of the Act, 21 U.S.C.section 360bbb-3(b)(1), unless the authorization is terminated  or revoked sooner.       Influenza A by PCR NEGATIVE NEGATIVE Final   Influenza B by PCR NEGATIVE NEGATIVE Final    Comment: (NOTE) The Xpert Xpress SARS-CoV-2/FLU/RSV plus assay is intended as an aid in the diagnosis of influenza from Nasopharyngeal swab specimens and should not be used as a sole basis for treatment. Nasal washings and aspirates are unacceptable for Xpert Xpress SARS-CoV-2/FLU/RSV testing.  Fact Sheet for Patients: BloggerCourse.com  Fact Sheet for Healthcare Providers: SeriousBroker.it  This test is not yet approved or cleared by the [place] FDA and has been authorized for detection and/or diagnosis of SARS-CoV-2 by FDA under an Emergency Use Authorization (EUA). This EUA will remain in effect (meaning this test can be used) for the duration of the COVID-19 declaration under Section 564(b)(1) of the Act, 21 U.S.C. section 360bbb-3(b)(1), unless the authorization is terminated or revoked.  Performed at [facility], [address], [place], [place] [address]      Signed: Lorin Glass  [facility] 10/16/2020, 11:07 AM

## 2020-10-17 ENCOUNTER — Ambulatory Visit: Payer: Self-pay | Admitting: Family Medicine

## 2020-10-17 NOTE — Discharge Summary (Signed)
Physician Discharge Summary  Jordan Stevens VPX:[id number] DOB: 1990-05-03 DOA: 10/13/2020  PCP: Duanne Limerick, MD  Admit date: 10/13/2020 Discharge date: 10/17/2020  Admitted From: Home Discharge disposition: Home   Code Status: Prior  Diet Recommendation: Regular diet  Discharge Diagnosis:   Active Problems:   Mild intermittent asthma   Alcohol abuse with intoxication delirium (HCC)   Increased anion gap metabolic acidosis   Hepatic steatosis  History of Present Illness / Brief narrative:  Jordan Stevens is a 30 y.o. female with medical history significant of asthma and alcohol abuse with multiple ED visits (10/09/19, 05/10/20, 09/09/20) for alcoholic gastritis. She continues to drink greater that 750 ml daily of spirits.  She presented to the ED on 6/26 with abdominal pain, marked hallucination. In the ED, patient was tachycardic Labs showed elevated blood close level, elevated lactic acid, low serum bicarbonate level. She was given IV hydration Admitted to hospital service for alcoholic ketoacidosis and impending withdrawal Clinically improved. Ready to go home today.    Subjective:  Seen and examined this morning.  Lying on bed.  Not in distress.  No hallucination, tremors. Continues to have mild headache and abdominal pain.  Hospital Course:  Alcohol intoxication Alcoholic ketoacidosis -Chronically drinks up to 1 L of spirits per day -Admitted with intoxication, alcoholic ketoacidosis -Clinically and metabolically improved with IV hydration. -CIWA protocol used to prevent withdrawal symptoms. -Currently stable enough to go home.  Counseled to quit alcohol.  Transaminitis/hepatic steatosis -Secondary to alcoholism.  Chronic alcoholic gastritis -No evidence of active bleeding.  Continue H2 blocker  Hypokalemia/hypomagnesemia -Improving with replacement. Recent Labs  Lab 10/13/20 0701 10/13/20 0827 10/14/20 0604 10/15/20 0448 10/16/20 0506  K 3.2*  --  3.2* 3.1*  3.6  MG  --  1.6* 2.0 1.7  --     History of asthma Mild intermittent asthma -Continue as needed inhalers   Tobacco abuse -Smokes about half a pack a day -Advised to quit   Obesity -Lifestyle modification advised  Wound care:    Discharge Exam:   Vitals:   10/15/20 1536 10/15/20 2011 10/16/20 0427 10/16/20 0758  BP: 123/89 130/88 114/74 115/79  Pulse: 82 85 98 83  Resp: 18 15  18   Temp: 98.1 F (36.7 C) 98.6 F (37 C) 98.2 F (36.8 C) 98.4 F (36.9 C)  TempSrc:  Oral Oral Oral  SpO2: 100% 100% 97% 100%  Weight:      Height:        Body mass index is 32.61 kg/m.  General exam: Pleasant, young African-American female.  Not in distress Skin: No rashes, lesions or ulcers. HEENT: Atraumatic, normocephalic, no obvious bleeding Lungs: Clear to auscultation bilaterally CVS: Regular rate and rhythm, no murmur GI/Abd soft, mild epigastric tenderness, nondistended CNS: Alert, awake, oriented x3 Psychiatry: Mood appropriate Extremities: No pedal edema, no calf tenderness  Follow ups:   Discharge Instructions     Diet general   Complete by: As directed    Increase activity slowly   Complete by: As directed        Follow-up Information     , MD Follow up on 10/17/2020.   Specialty: Family Medicine Why: 11am appointment Contact information: [address] [place] [id number]                 Recommendations for Outpatient Follow-Up:   Follow-up with PCP as an outpatient  Discharge Instructions:  Follow with Primary MD [phone], MD  in 7 days   Get CBC/BMP checked in next visit within 1 week by PCP or SNF MD ( we routinely change or add medications that can affect your baseline labs and fluid status, therefore we recommend that you get the mentioned basic workup next visit with your PCP, your PCP may decide not to get them or add new tests based on their clinical decision)  On your next visit with your  PCP, please Get Medicines reviewed and adjusted.  Please request your PCP  to go over all Hospital Tests and Procedure/Radiological results at the follow up, please get all Hospital records sent to your Prim MD by signing hospital release before you go home.  Activity: As tolerated with Full fall precautions use walker/cane & assistance as needed  For Heart failure patients - Check your Weight same time everyday, if you gain over 2 pounds, or you develop in leg swelling, experience more shortness of breath or chest pain, call your Primary MD immediately. Follow Cardiac Low Salt Diet and 1.5 lit/day fluid restriction.  If you have smoked or chewed Tobacco in the last 2 yrs please stop smoking, stop any regular Alcohol  and or any Recreational drug use.  If you experience worsening of your admission symptoms, develop shortness of breath, life threatening emergency, suicidal or homicidal thoughts you must seek medical attention immediately by calling 911 or calling your MD immediately  if symptoms less severe.  You Must read complete instructions/literature along with all the possible adverse reactions/side effects for all the Medicines you take and that have been prescribed to you. Take any new Medicines after you have completely understood and accpet all the possible adverse reactions/side effects.   Do not drive, operate heavy machinery, perform activities at heights, swimming or participation in water activities or provide baby sitting services if your were admitted for syncope or siezures until you have seen by Primary MD or a Neurologist and advised to do so again.  Do not drive when taking Pain medications.  Do not take more than prescribed Pain, Sleep and Anxiety Medications  Wear Seat belts while driving.   Please note You were cared for by a hospitalist during your hospital stay. If you have any questions about your discharge medications or the care you received while you were in the  hospital after you are discharged, you can call the unit and asked to speak with the hospitalist on call if the hospitalist that took care of you is not available. Once you are discharged, your primary care physician will handle any further medical issues. Please note that NO REFILLS for any discharge medications will be authorized once you are discharged, as it is imperative that you return to your primary care physician (or establish a relationship with a primary care physician if you do not have one) for your aftercare needs so that they can reassess your need for medications and monitor your lab values.    Allergies as of 10/16/2020   No Known Allergies      Medication List     STOP taking these medications    azithromycin 250 MG tablet Commonly known as: ZITHROMAX   guaiFENesin-codeine 100-10 MG/5ML syrup Commonly known as: ROBITUSSIN AC   montelukast 10 MG tablet Commonly known as: SINGULAIR   ondansetron 4 MG disintegrating tablet Commonly known as: Zofran ODT   predniSONE 20 MG tablet Commonly known as: DELTASONE       TAKE these medications    albuterol (2.5 MG/3ML) 0.083%  nebulizer solution Commonly known as: PROVENTIL TAKE 3 MLS BY NEBULIZATION EVERY 6 HOURS AS NEEDED FOR WHEEZING OR SHORTNESS OF BREATH.   albuterol 108 (90 Base) MCG/ACT inhaler Commonly known as: VENTOLIN HFA TAKE 2 PUFFS BY MOUTH EVERY 6 HOURS AS NEEDED FOR WHEEZE OR SHORTNESS OF BREATH   cetirizine 10 MG tablet Commonly known as: ZYRTEC Take 10 mg by mouth daily.   famotidine 20 MG tablet Commonly known as: PEPCID Take 1 tablet (20 mg total) by mouth every evening. What changed:  medication strength how much to take        Time coordinating discharge: 35 minutes  The results of significant diagnostics from this hospitalization (including imaging, microbiology, ancillary and laboratory) are listed below for reference.    Procedures and Diagnostic Studies:   US ABDOMEN LIMITED  RUQ (LIVER/GB)  Result Date: 10/13/2020 CLINICAL DATA:  Abdominal pain EXAM: ULTRASOUND ABDOMEN LIMITED RIGHT UPPER QUADRANT COMPARISON:  None. FINDINGS: Gallbladder: No gallstones or wall thickening visualized. No sonographic Murphy sign noted by sonographer. Common bile duct: Diameter: 3 mm Liver: Echogenic liver with diminished acoustic penetration. No focal lesion. Portal vein is patent on color Doppler imaging with normal direction of blood flow towards the liver. Other: Borderline increased cortical echogenicity of the visualized right kidney with prominent corticomedullary differentiation. IMPRESSION: 1. Negative gallbladder. 2. Hepatic steatosis. Electronically Signed   By: Marnee Spring M.D.   On: 10/13/2020 09:24     Labs:   Basic Metabolic Panel: Recent Labs  Lab 10/13/20 0701 10/13/20 0827 10/14/20 0604 10/15/20 0448 10/16/20 0506  NA 133*  --  139 136 137  K 3.2*  --  3.2* 3.1* 3.6  CL 93*  --  108 106 105  CO2 17*  --  26 25 25   GLUCOSE 210*  --  104* 96 106*  BUN 23*  --  11 9 5*  CREATININE 1.11*  --  0.67 0.52 0.57  CALCIUM 9.4  --  8.9 8.5* 9.3  MG  --  1.6* 2.0 1.7  --    GFR Estimated Creatinine Clearance: 109.2 mL/min (by C-G formula based on SCr of 0.57 mg/dL). Liver Function Tests: Recent Labs  Lab 10/13/20 0701 10/14/20 0604 10/15/20 0448 10/16/20 0506  AST 68* 43* 42* 75*  ALT 72* 43 41 65*  ALKPHOS 60 40 43 47  BILITOT 1.8* 2.1* 1.3* 1.1  PROT 9.0* 6.7 6.1* 6.5  ALBUMIN 4.7 3.6 3.3* 3.4*   Recent Labs  Lab 10/13/20 0701  LIPASE 77*   No results for input(s): AMMONIA in the last 168 hours. Coagulation profile Recent Labs  Lab 10/13/20 0935 10/14/20 0604  INR 1.1 1.0    CBC: Recent Labs  Lab 10/13/20 0701 10/15/20 0448 10/16/20 0506  WBC 5.8 3.9* 4.1  NEUTROABS  --  1.4* 1.7  HGB 14.0 10.3* 10.6*  HCT 40.1 31.0* 32.6*  MCV 70.6* 73.5* 75.1*  PLT 367 165 167   Cardiac Enzymes: Recent Labs  Lab 10/13/20 0827  CKTOTAL  384*   BNP: Invalid input(s): POCBNP CBG: No results for input(s): GLUCAP in the last 168 hours. D-Dimer No results for input(s): DDIMER in the last 72 hours. Hgb A1c No results for input(s): HGBA1C in the last 72 hours. Lipid Profile No results for input(s): CHOL, HDL, LDLCALC, TRIG, CHOLHDL, LDLDIRECT in the last 72 hours. Thyroid function studies No results for input(s): TSH, T4TOTAL, T3FREE, THYROIDAB in the last 72 hours.  Invalid input(s): FREET3 Anemia work up No results for  input(s): VITAMINB12, FOLATE, FERRITIN, TIBC, IRON, RETICCTPCT in the last 72 hours. Microbiology Recent Results (from the past 240 hour(s))  Resp Panel by RT-PCR (Flu A&B, Covid) Nasopharyngeal Swab     Status: None   Collection Time: 10/13/20  8:15 AM   Specimen: Nasopharyngeal Swab; Nasopharyngeal(NP) swabs in vial transport medium  Result Value Ref Range Status   SARS Coronavirus 2 by RT PCR NEGATIVE NEGATIVE Final    Comment: (NOTE) SARS-CoV-2 target nucleic acids are NOT DETECTED.  The SARS-CoV-2 RNA is generally detectable in upper respiratory specimens during the acute phase of infection. The lowest concentration of SARS-CoV-2 viral copies this assay can detect is 138 copies/mL. A negative result does not preclude SARS-Cov-2 infection and should not be used as the sole basis for treatment or other patient management decisions. A negative result may occur with  improper specimen collection/handling, submission of specimen other than nasopharyngeal swab, presence of viral mutation(s) within the areas targeted by this assay, and inadequate number of viral copies(<138 copies/mL). A negative result must be combined with clinical observations, patient history, and epidemiological information. The expected result is Negative.  Fact Sheet for Patients:  BloggerCourse.com  Fact Sheet for Healthcare Providers:  SeriousBroker.it  This test is no t  yet approved or cleared by the [place] FDA and  has been authorized for detection and/or diagnosis of SARS-CoV-2 by FDA under an Emergency Use Authorization (EUA). This EUA will remain  in effect (meaning this test can be used) for the duration of the COVID-19 declaration under Section 564(b)(1) of the Act, 21 U.S.C.section 360bbb-3(b)(1), unless the authorization is terminated  or revoked sooner.       Influenza A by PCR NEGATIVE NEGATIVE Final   Influenza B by PCR NEGATIVE NEGATIVE Final    Comment: (NOTE) The Xpert Xpress SARS-CoV-2/FLU/RSV plus assay is intended as an aid in the diagnosis of influenza from Nasopharyngeal swab specimens and should not be used as a sole basis for treatment. Nasal washings and aspirates are unacceptable for Xpert Xpress SARS-CoV-2/FLU/RSV testing.  Fact Sheet for Patients: BloggerCourse.com  Fact Sheet for Healthcare Providers: SeriousBroker.it  This test is not yet approved or cleared by the [place] FDA and has been authorized for detection and/or diagnosis of SARS-CoV-2 by FDA under an Emergency Use Authorization (EUA). This EUA will remain in effect (meaning this test can be used) for the duration of the COVID-19 declaration under Section 564(b)(1) of the Act, 21 U.S.C. section 360bbb-3(b)(1), unless the authorization is terminated or revoked.  Performed at [facility], [address] [place], [place] [address]      Signed: Lorin Glass  [facility] 10/17/2020, 7:14 AM

## 2020-10-25 ENCOUNTER — Ambulatory Visit: Payer: Self-pay | Admitting: Family Medicine

## 2020-11-01 ENCOUNTER — Ambulatory Visit: Payer: Self-pay | Admitting: Family Medicine

## 2021-02-13 ENCOUNTER — Other Ambulatory Visit: Payer: Self-pay

## 2021-02-13 ENCOUNTER — Emergency Department
Admission: EM | Admit: 2021-02-13 | Discharge: 2021-02-14 | Disposition: A | Discharge status: Home / Self Care | Payer: Medicaid Other | Attending: Emergency Medicine | Admitting: Emergency Medicine

## 2021-02-13 DIAGNOSIS — Z5321 Procedure and treatment not carried out due to patient leaving prior to being seen by health care provider: Secondary | ICD-10-CM | POA: Insufficient documentation

## 2021-02-13 DIAGNOSIS — J45909 Unspecified asthma, uncomplicated: Secondary | ICD-10-CM | POA: Diagnosis not present

## 2021-02-13 DIAGNOSIS — R0789 Other chest pain: Secondary | ICD-10-CM | POA: Diagnosis present

## 2021-02-13 LAB — URINALYSIS, ROUTINE W REFLEX MICROSCOPIC
Bilirubin Urine: NEGATIVE
Glucose, UA: NEGATIVE mg/dL
Ketones, ur: 20 mg/dL — AB
Leukocytes,Ua: NEGATIVE
Nitrite: NEGATIVE
Protein, ur: 100 mg/dL — AB
Specific Gravity, Urine: 1.01 (ref 1.005–1.030)
pH: 7 (ref 5.0–8.0)

## 2021-02-13 LAB — CBC
HCT: 40.4 % (ref 36.0–46.0)
Hemoglobin: 13.8 g/dL (ref 12.0–15.0)
MCH: 24.6 pg — ABNORMAL LOW (ref 26.0–34.0)
MCHC: 34.2 g/dL (ref 30.0–36.0)
MCV: 71.9 fL — ABNORMAL LOW (ref 80.0–100.0)
Platelets: 322 10*3/uL (ref 150–400)
RBC: 5.62 MIL/uL — ABNORMAL HIGH (ref 3.87–5.11)
RDW: 16.1 % — ABNORMAL HIGH (ref 11.5–15.5)
WBC: 4.2 10*3/uL (ref 4.0–10.5)
nRBC: 0 % (ref 0.0–0.2)

## 2021-02-13 LAB — LIPASE, BLOOD: Lipase: 38 U/L (ref 11–51)

## 2021-02-13 LAB — COMPREHENSIVE METABOLIC PANEL WITH GFR
ALT: 42 U/L (ref 0–44)
AST: 51 U/L — ABNORMAL HIGH (ref 15–41)
Albumin: 4.7 g/dL (ref 3.5–5.0)
Alkaline Phosphatase: 69 U/L (ref 38–126)
Anion gap: 17 — ABNORMAL HIGH (ref 5–15)
BUN: 8 mg/dL (ref 6–20)
CO2: 27 mmol/L (ref 22–32)
Calcium: 9.2 mg/dL (ref 8.9–10.3)
Chloride: 93 mmol/L — ABNORMAL LOW (ref 98–111)
Creatinine, Ser: 0.73 mg/dL (ref 0.44–1.00)
GFR, Estimated: >60 mL/min (ref >60)
Glucose, Bld: 101 mg/dL — ABNORMAL HIGH (ref 70–99)
Potassium: 3 mmol/L — ABNORMAL LOW (ref 3.5–5.1)
Sodium: 137 mmol/L (ref 135–145)
Total Bilirubin: 1.4 mg/dL — ABNORMAL HIGH (ref 0.3–1.2)
Total Protein: 8.6 g/dL — ABNORMAL HIGH (ref 6.5–8.1)

## 2021-02-13 LAB — ETHANOL: Alcohol, Ethyl (B): 274 mg/dL — ABNORMAL HIGH (ref <10)

## 2021-02-13 NOTE — ED Triage Notes (Addendum)
Patient c/o right side pain with nausea. Denies injury or diarrhea. Patient reports she normally does not drink alcohol and this past week she went on a binge of drinking and believes that is why she is hurting. Reports drinking two shots and a couple of seltzers this AM.

## 2021-02-13 NOTE — ED Triage Notes (Signed)
Pt in via EMS from home with c/o  Pt called 911 and she hung up, they called back and no answer so LEO went to home. Pt admits to drinking some last night and this am and reports she has some chest tightness and felt SOB. 160/98, 100% RA, HR 130, hx of anxiety and asthma, lungs clear per EMS

## 2021-02-14 NOTE — ED Notes (Signed)
No answer when called several times from lobby 

## 2021-03-16 ENCOUNTER — Other Ambulatory Visit: Payer: Self-pay

## 2021-03-16 ENCOUNTER — Emergency Department
Admission: EM | Admit: 2021-03-16 | Discharge: 2021-03-17 | Disposition: A | Discharge status: Home / Self Care | Payer: Medicaid Other | Attending: Emergency Medicine | Admitting: Emergency Medicine

## 2021-03-16 DIAGNOSIS — F1010 Alcohol abuse, uncomplicated: Secondary | ICD-10-CM

## 2021-03-16 DIAGNOSIS — Y908 Blood alcohol level of 240 mg/100 ml or more: Secondary | ICD-10-CM | POA: Insufficient documentation

## 2021-03-16 DIAGNOSIS — F17210 Nicotine dependence, cigarettes, uncomplicated: Secondary | ICD-10-CM | POA: Diagnosis not present

## 2021-03-16 DIAGNOSIS — F10129 Alcohol abuse with intoxication, unspecified: Secondary | ICD-10-CM | POA: Diagnosis present

## 2021-03-16 DIAGNOSIS — R45851 Suicidal ideations: Secondary | ICD-10-CM | POA: Insufficient documentation

## 2021-03-16 DIAGNOSIS — F10188 Alcohol abuse with other alcohol-induced disorder: Secondary | ICD-10-CM | POA: Insufficient documentation

## 2021-03-16 DIAGNOSIS — F1019 Alcohol abuse with unspecified alcohol-induced disorder: Secondary | ICD-10-CM

## 2021-03-16 DIAGNOSIS — F32A Depression, unspecified: Secondary | ICD-10-CM | POA: Diagnosis not present

## 2021-03-16 DIAGNOSIS — J45909 Unspecified asthma, uncomplicated: Secondary | ICD-10-CM | POA: Diagnosis not present

## 2021-03-16 DIAGNOSIS — R109 Unspecified abdominal pain: Secondary | ICD-10-CM | POA: Diagnosis not present

## 2021-03-16 DIAGNOSIS — F329 Major depressive disorder, single episode, unspecified: Secondary | ICD-10-CM | POA: Diagnosis not present

## 2021-03-16 DIAGNOSIS — Z20822 Contact with and (suspected) exposure to covid-19: Secondary | ICD-10-CM | POA: Diagnosis not present

## 2021-03-16 LAB — CBC WITH DIFFERENTIAL/PLATELET
Abs Immature Granulocytes: 0.01 10*3/uL (ref 0.00–0.07)
Basophils Absolute: 0 10*3/uL (ref 0.0–0.1)
Basophils Relative: 1 %
Eosinophils Absolute: 0.1 10*3/uL (ref 0.0–0.5)
Eosinophils Relative: 2 %
HCT: 37.4 % (ref 36.0–46.0)
Hemoglobin: 12.5 g/dL (ref 12.0–15.0)
Immature Granulocytes: 0 %
Lymphocytes Relative: 48 %
Lymphs Abs: 2.3 10*3/uL (ref 0.7–4.0)
MCH: 24.6 pg — ABNORMAL LOW (ref 26.0–34.0)
MCHC: 33.4 g/dL (ref 30.0–36.0)
MCV: 73.6 fL — ABNORMAL LOW (ref 80.0–100.0)
Monocytes Absolute: 0.3 10*3/uL (ref 0.1–1.0)
Monocytes Relative: 6 %
Neutro Abs: 2.1 10*3/uL (ref 1.7–7.7)
Neutrophils Relative %: 43 %
Platelets: 395 10*3/uL (ref 150–400)
RBC: 5.08 MIL/uL (ref 3.87–5.11)
RDW: 17.1 % — ABNORMAL HIGH (ref 11.5–15.5)
WBC: 4.9 10*3/uL (ref 4.0–10.5)
nRBC: 0 % (ref 0.0–0.2)

## 2021-03-16 LAB — COMPREHENSIVE METABOLIC PANEL
ALT: 20 U/L (ref 0–44)
AST: 26 U/L (ref 15–41)
Albumin: 4.2 g/dL (ref 3.5–5.0)
Alkaline Phosphatase: 63 U/L (ref 38–126)
Anion gap: 7 (ref 5–15)
BUN: 7 mg/dL (ref 6–20)
CO2: 27 mmol/L (ref 22–32)
Calcium: 8.8 mg/dL — ABNORMAL LOW (ref 8.9–10.3)
Chloride: 109 mmol/L (ref 98–111)
Creatinine, Ser: 0.56 mg/dL (ref 0.44–1.00)
GFR, Estimated: >60 mL/min (ref >60)
Glucose, Bld: 114 mg/dL — ABNORMAL HIGH (ref 70–99)
Potassium: 3.3 mmol/L — ABNORMAL LOW (ref 3.5–5.1)
Sodium: 143 mmol/L (ref 135–145)
Total Bilirubin: 0.8 mg/dL (ref 0.3–1.2)
Total Protein: 7.8 g/dL (ref 6.5–8.1)

## 2021-03-16 LAB — RESP PANEL BY RT-PCR (FLU A&B, COVID) ARPGX2
Influenza A by PCR: NEGATIVE
Influenza B by PCR: NEGATIVE
SARS Coronavirus 2 by RT PCR: NEGATIVE

## 2021-03-16 LAB — ETHANOL: Alcohol, Ethyl (B): 397 mg/dL (ref <10)

## 2021-03-16 LAB — ACETAMINOPHEN LEVEL: Acetaminophen (Tylenol), Serum: <10 ug/mL — ABNORMAL LOW (ref 10–30)

## 2021-03-16 LAB — SALICYLATE LEVEL: Salicylate Lvl: <7 mg/dL — ABNORMAL LOW (ref 7.0–30.0)

## 2021-03-16 MED ORDER — ALUM & MAG HYDROXIDE-SIMETH 200-200-20 MG/5ML PO SUSP
30.0000 mL | Freq: Once | ORAL | Status: AC
  Administered 2021-03-16: 22:00:00 30 mL via ORAL
  Filled 2021-03-16: qty 30

## 2021-03-16 MED ORDER — LIDOCAINE VISCOUS HCL 2 % MT SOLN
15.0000 mL | Freq: Once | OROMUCOSAL | Status: AC
  Administered 2021-03-16: 22:00:00 15 mL via ORAL
  Filled 2021-03-16: qty 15

## 2021-03-16 NOTE — ED Notes (Signed)
EDP Derrill Kay notified of critical Ethanol level of 397. No new orders at this time.

## 2021-03-16 NOTE — ED Provider Notes (Addendum)
[facility] Emergency Department Provider Note   ____________________________________________   I have reviewed the triage vital signs and the nursing notes.   HISTORY  Chief Complaint Suicidal   History limited by: Not Limited   HPI Jordan Stevens is a 30 y.o. female who presents to the emergency department today because of concerns for depression and suicidal ideation.  She states that a lot of the centers around family issues.  She has been feeling this way for a while.  She also states that she has been drinking alcohol.  When she does drink alcohol she has abdominal pain.  This concerns her because her father had issues with ulcers.    Records reviewed. Per medical record review patient has a history of alcohol abuse.   Past Medical History:  Diagnosis Date   Alcohol abuse    multiple episode gastritis, episode intoxication with delerium June '22   Asthma 30 years old    Patient Active Problem List   Diagnosis Date Noted   Alcohol abuse with intoxication delirium (HCC) 10/13/2020   Increased anion gap metabolic acidosis 10/13/2020   Hepatic steatosis 10/13/2020   Mild intermittent asthma 05/21/2016   Abnormal hemoglobin (HCC) 11/12/2014    Past Surgical History:  Procedure Laterality Date   LEG SURGERY Left 2010   TONSILLECTOMY      Prior to Admission medications   Medication Sig Start Date End Date Taking? Authorizing Provider  albuterol (PROVENTIL) (2.5 MG/3ML) 0.083% nebulizer solution TAKE 3 MLS BY NEBULIZATION EVERY 6 HOURS AS NEEDED FOR WHEEZING OR SHORTNESS OF BREATH. 08/03/19   Duanne Limerick, MD  albuterol (VENTOLIN HFA) 108 (90 Base) MCG/ACT inhaler TAKE 2 PUFFS BY MOUTH EVERY 6 HOURS AS NEEDED FOR WHEEZE OR SHORTNESS OF BREATH 10/29/19   Duanne Limerick, MD  cetirizine (ZYRTEC) 10 MG tablet Take 10 mg by mouth daily. 07/17/19   [provider]  famotidine (PEPCID) 20 MG tablet Take 1 tablet (20 mg total) by mouth every  evening. 10/16/20 10/16/21  Lorin Glass, MD    Allergies Patient has no known allergies.  Family History  Problem Relation Age of Onset   Cancer Mother    Alcoholism Father    Diabetes Maternal Grandmother    COPD Paternal Uncle     Social History Social History   Tobacco Use   Smoking status: Some Days    Types: Cigarettes   Smokeless tobacco: Never  Substance Use Topics   Alcohol use: Yes   Drug use: No    Review of Systems Constitutional: No fever/chills Eyes: No visual changes. ENT: No sore throat. Cardiovascular: Denies chest pain. Respiratory: Denies shortness of breath. Gastrointestinal: Positive for abdominal pain. Genitourinary: Negative for dysuria. Musculoskeletal: Negative for back pain. Skin: Negative for rash. Neurological: Negative for headaches, focal weakness or numbness.  ____________________________________________   PHYSICAL EXAM:  VITAL SIGNS: ED Triage Vitals  Enc Vitals Group     BP 03/16/21 2001 121/85     Pulse Rate 03/16/21 2001 98     Resp 03/16/21 2001 18     Temp 03/16/21 2001 98.4 F (36.9 C)     Temp Source 03/16/21 2001 Oral     SpO2 03/16/21 2001 98 %     Weight 03/16/21 1944 207 lb 3.7 oz (94 kg)     Height 03/16/21 1944 5\' 4"  (1.626 m)     Head Circumference --      Peak Flow --      Pain Score  03/16/21 1944 2   Constitutional: Alert and oriented.  Eyes: Conjunctivae are normal.  ENT      Head: Normocephalic and atraumatic.      Nose: No congestion/rhinnorhea.      Mouth/Throat: Mucous membranes are moist.      Neck: No stridor. Hematological/Lymphatic/Immunilogical: No cervical lymphadenopathy. Cardiovascular: Normal rate, regular rhythm.  No murmurs, rubs, or gallops.  Respiratory: Normal respiratory effort without tachypnea nor retractions. Breath sounds are clear and equal bilaterally. No wheezes/rales/rhonchi. Gastrointestinal: Soft and non tender. No rebound. No guarding.  Genitourinary:  Deferred Musculoskeletal: Normal range of motion in all extremities. No lower extremity edema. Neurologic:  Normal speech and language. No gross focal neurologic deficits are appreciated.  Skin:  Skin is warm, dry and intact. No rash noted. Psychiatric: Depressed.  ____________________________________________   PROCEDURES  Procedures  ____________________________________________   INITIAL IMPRESSION / ASSESSMENT AND PLAN / ED COURSE  Pertinent labs & imaging results that were available during my care of the patient were reviewed by me and considered in my medical decision making (see chart for details).   Patient presented to the emergency department today because of concerns for depression.  Patient secondary complaint of abdominal pain secondary to alcohol use.  In terms of the abdominal pain do think likely gastritis.  Will give GI cocktail.  Will have psychiatry evaluate.  The patient has been placed in psychiatric observation due to the need to provide a safe environment for the patient while obtaining psychiatric consultation and evaluation, as well as ongoing medical and medication management to treat the patient's condition.  The patient has been placed under full IVC at this time.  ____________________________________________   FINAL CLINICAL IMPRESSION(S) / ED DIAGNOSES  Final diagnoses:  Depression, unspecified depression type  Alcohol abuse     Note: This dictation was prepared with Dragon dictation. Any transcriptional errors that result from this process are unintentional     Phineas Semen, MD 03/16/21 2056    Phineas Semen, MD 03/16/21 2149

## 2021-03-16 NOTE — ED Notes (Signed)
Pt dressed out tech [place], belongings include:   Small hoop earrings Black iphone  Pink crocs Black tank top [employer]

## 2021-03-16 NOTE — ED Notes (Signed)
IVC/Psych Consult Ordered °

## 2021-03-16 NOTE — ED Triage Notes (Signed)
BIB [facility] from home due to feeling suicidal and mild abdominal pain. States abdominal pain is generalized and started today; states SI has been going on " awhile". Denies hx of SI attempts; and initially denies any plan of how to hurt herself currently--Pt then admits to telling EMS that she had thoughts of jumping off balcony of apartment that she lives in. Denies HI. Admits to drinking " a lot today". Tearful on assessment.

## 2021-03-17 DIAGNOSIS — F32A Depression, unspecified: Secondary | ICD-10-CM

## 2021-03-17 DIAGNOSIS — F1019 Alcohol abuse with unspecified alcohol-induced disorder: Secondary | ICD-10-CM

## 2021-03-17 MED ORDER — ALBUTEROL SULFATE HFA 108 (90 BASE) MCG/ACT IN AERS
2.0000 | INHALATION_SPRAY | Freq: Four times a day (QID) | RESPIRATORY_TRACT | Status: DC | PRN
  Administered 2021-03-17: 03:00:00 2 via RESPIRATORY_TRACT
  Filled 2021-03-17 (×2): qty 6.7

## 2021-03-17 NOTE — BH Assessment (Signed)
Comprehensive Clinical Assessment (CCA) Note  03/17/2021 Miku Greving [id number]  Chief Complaint:  Chief Complaint  Patient presents with   Suicidal   Visit Diagnosis: Alcohol Use Mood Disorder   Jordan Stevens is a 30 year old female who presents to the ER after she called 911 because of her stomach from drinking alcohol. When EMS asked her about SI she told them she could jump off the balcony. She was at her home when she made the statement.  Per her report, she didn't mean she would end her life but if she would, she could do it by jumping. Patient was also intoxicated when she made the statement. Upon arrival to the ER, her BAC was 397.   Protective factors include; have a full-time job she likes and 17-year-old son she's raising. She is also connected with a counselor for outpatient counseling.  During the interview the patient was calm, cooperative and pleasant. She was able to provide appropriate answers to the questions. Throughout the interview the patient denied SI/HI and AV/H. She denies the use of mind-altering substance, except alcohol.   CCA Screening, Triage and Referral (STR)  Patient Reported Information How did you hear about [place]? Self  What Is the Reason for Your Visit/Call Today? Came to the ER because of stomach pains from alcohol abuse.  How Long Has This Been Causing You Problems? 1-6 months  What Do You Feel Would Help You the Most Today? Alcohol or Drug Use Treatment   Have You Recently Had Any Thoughts About Hurting Yourself? No  Are You Planning to Commit Suicide/Harm Yourself At This time? No   Have you Recently Had Thoughts About [place]? No  Are You Planning to Harm Someone at This Time? No  Explanation: No data recorded  Have You Used Any Alcohol or Drugs in the Past 24 Hours? Yes  How Long Ago Did You Use Drugs or Alcohol? No data recorded What Did You Use and How Much? Alcohol   Do You Currently Have a Therapist/Psychiatrist?  Yes  Name of Therapist/Psychiatrist: In therapy   Have You Been Recently Discharged From Any Office Practice or Programs? No  Explanation of Discharge From Practice/Program: No data recorded    CCA Screening Triage Referral Assessment Type of Contact: Face-to-Face  Telemedicine Service Delivery:   Is this Initial or Reassessment? No data recorded Date Telepsych consult ordered in CHL:  No data recorded Time Telepsych consult ordered in CHL:  No data recorded Location of Assessment: [facility] ED  Provider Location: [facility] ED   Collateral Involvement: No data recorded  Does Patient Have a [place]? No data recorded Name and Contact of Legal Guardian: No data recorded If Minor and Not Living with Parent(s), Who has Custody? No data recorded Is CPS involved or ever been involved? Never  Is APS involved or ever been involved? Never   Patient Determined To Be At Risk for Harm To Self or Others Based on Review of Patient Reported Information or Presenting Complaint? No  Method: No data recorded Availability of Means: No data recorded Intent: No data recorded Notification Required: No data recorded Additional Information for Danger to Others Potential: No data recorded Additional Comments for Danger to Others Potential: No data recorded Are There Guns or Other Weapons in Your Home? No data recorded Types of Guns/Weapons: No data recorded Are These Weapons Safely Secured?  No data recorded Who Could Verify You Are Able To Have These Secured: No data recorded Do You Have any Outstanding Charges, Pending Court Dates, Parole/Probation? No data recorded Contacted To Inform of Risk of Harm To Self or Others: No data recorded   Does Patient Present under Involuntary Commitment? Yes  IVC Papers Initial File Date: 03/16/21   [place] of Residence:    Patient Currently Receiving the Following Services: Individual  Therapy   Determination of Need: Emergent (2 hours)   Options For Referral: ED Referral   CCA Biopsychosocial Patient Reported Schizophrenia/Schizoaffective Diagnosis in Past: No   Strengths: Have some insight, in therapy voluntary and have support system   Mental Health Symptoms Depression:   Change in energy/activity   Duration of Depressive symptoms:  Duration of Depressive Symptoms: Greater than two weeks   Mania:   N/A   Anxiety:    N/A   Psychosis:   None   Duration of Psychotic symptoms:    Trauma:   N/A   Obsessions:   N/A   Compulsions:   N/A   Inattention:   N/A   Hyperactivity/Impulsivity:   N/A   Oppositional/Defiant Behaviors:   N/A   Emotional Irregularity:   N/A   Other Mood/Personality Symptoms:  No data recorded   Mental Status Exam Appearance and self-care  Stature:   Average   Weight:   Average weight   Clothing:   Age-appropriate; Neat/clean   Grooming:   Normal   Cosmetic use:   Age appropriate   Posture/gait:   Normal   Motor activity:   -- (within normal range)   Sensorium  Attention:   Normal   Concentration:   Normal   Orientation:   X5   Recall/memory:   Normal   Affect and Mood  Affect:   Appropriate; Anxious   Mood:   Anxious   Relating  Eye contact:   Normal   Facial expression:   Responsive   Attitude toward examiner:   Cooperative   Thought and Language  Speech flow:  Clear and Coherent; Normal   Thought content:   Appropriate to Mood and Circumstances   Preoccupation:   None   Hallucinations:   None   Organization:  No data recorded  [employer] of Knowledge:   Fair   Intelligence:   Average   Abstraction:   Normal   Judgement:   Impaired   Reality Testing:   Adequate; Realistic   Insight:   Poor   Decision Making:   Impulsive   Social Functioning  Social Maturity:   Responsible; Impulsive   Social Judgement:   Normal;  "Fish farm manager   Stress  Stressors:   Family conflict; Relationship   Coping Ability:   Programme researcher, broadcasting/film/video Deficits:   None   Supports:   Family; Friends/Service system     Religion: Religion/Spirituality Are You A Religious Person?: No  Leisure/Recreation: Leisure / Recreation Do You Have Hobbies?: No  Exercise/Diet: Exercise/Diet Do You Exercise?: No Have You Gained or Lost A Significant Amount of Weight in the Past Six Months?: No Do You Follow a Special Diet?: No Do You Have Any Trouble Sleeping?: No   CCA Employment/Education Employment/Work Situation: Employment / Work Situation Employment Situation: Employed Work Stressors: None reported Patient's Job has Been Impacted by Current Illness: No Has Patient ever Been in Passenger transport manager?: No  Education: Education Is Patient Currently Attending School?: No Did You Nutritional therapist?: No Did You Have An  Individualized Education Program (IIEP): No Did You Have Any Difficulty At School?: No Patient's Education Has Been Impacted by Current Illness: No   CCA Family/Childhood History Family and Relationship History: Family history Marital status: Single Does patient have children?: Yes How many children?: 1 How is patient's relationship with their children?: States it's good  Childhood History:  Childhood History By whom was/is the patient raised?: Mother Did patient suffer any verbal/emotional/physical/sexual abuse as a child?: No Did patient suffer from severe childhood neglect?: No Has patient ever been sexually abused/assaulted/raped as an adolescent or adult?: No Was the patient ever a victim of a crime or a disaster?: No Witnessed domestic violence?: No Has patient been affected by domestic violence as an adult?: No  Child/Adolescent Assessment:     CCA Substance Use Alcohol/Drug Use: Alcohol / Drug Use Pain Medications: See PTA Prescriptions: See PTA Over the Counter: See PTA History of  alcohol / drug use?: Yes Longest period of sobriety (when/how long): Unable to quantify Substance #1 Name of Substance 1: Alcohol 1 - Last Use / Amount: 03/16/2021   ASAM's:  Six Dimensions of Multidimensional Assessment  Dimension 1:  Acute Intoxication and/or Withdrawal Potential:      Dimension 2:  Biomedical Conditions and Complications:      Dimension 3:  Emotional, Behavioral, or Cognitive Conditions and Complications:     Dimension 4:  Readiness to Change:     Dimension 5:  Relapse, Continued use, or Continued Problem Potential:     Dimension 6:  Recovery/Living Environment:     ASAM Severity Score:    ASAM Recommended Level of Treatment:     Substance use Disorder (SUD)    Recommendations for Services/Supports/Treatments:    Discharge Disposition:    DSM5 Diagnoses: Patient Active Problem List   Diagnosis Date Noted   Alcohol abuse with intoxication delirium (HCC) 10/13/2020   Increased anion gap metabolic acidosis 10/13/2020   Hepatic steatosis 10/13/2020   Mild intermittent asthma 05/21/2016   Abnormal hemoglobin (HCC) 11/12/2014     Referrals to Alternative Service(s): Referred to Alternative Service(s):   Place:   Date:   Time:    Referred to Alternative Service(s):   Place:   Date:   Time:    Referred to Alternative Service(s):   Place:   Date:   Time:    Referred to Alternative Service(s):   Place:   Date:   Time:      Lilyan Gilford MS, LCAS, [facility], NCC Therapeutic Triage Specialist 03/17/2021 1:16 AM

## 2021-03-17 NOTE — ED Provider Notes (Signed)
Emergency Medicine Observation Re-evaluation Note  Jordan Stevens is a 30 y.o. female, seen on rounds today.  Pt initially presented to the ED for complaints of Suicidal Currently, the patient is resting.  Physical Exam  BP 121/85 (BP Location: Left Arm)   Pulse 98   Temp 98.4 F (36.9 C) (Oral)   Resp 18   Ht 1.626 m (5\' 4" )   Wt 94 kg   LMP 03/14/2021 (Approximate)   SpO2 98%   BMI 35.57 kg/m  Physical Exam  Gen:  No acute distress Resp:  Breathing easily and comfortably, no accessory muscle usage Neuro:  Moving all four extremities, no gross focal neuro deficits Psych:  Resting currently, argumentative when awake previously.  ED Course / MDM  EKG:   I have reviewed the labs performed to date as well as medications administered while in observation.  Recent changes in the last 24 hours include initial assessment by ED physician and psychiatry.  Plan  Current plan is for discharge when sober if she is back to a seemingly normal baseline.  Psychiatry did not feel that she meets inpatient treatment criteria but the patient was not sober and did not have the capacity to decide to leave on her own so she was kept in the ED overnight.03/16/2021 is under involuntary commitment, but as per psychiatry, her IVC can be revoked and she can be discharged when she is clinically sober.      Jacquiline Doe, MD 03/17/21 [phone]

## 2021-03-17 NOTE — ED Notes (Signed)
IVC/ Consult Completed/ Pending D/C in Am

## 2021-03-17 NOTE — Consult Note (Signed)
[facility] Psychiatry Consult   Reason for Consult:  Psychiatric Evaluation Referring Physician:  Dr. Archie Balboa Patient Identification: Jordan Stevens MRN:  [id number] Principal Diagnosis: Alcohol abuse with alcohol-induced disorder ([place]) Diagnosis:  Principal Problem:   Alcohol abuse with alcohol-induced disorder ([place])   Total Time spent with patient: 45 minutes  Subjective:   Per triage nurse Nuria Wittich is a 30 y.o. female patient admitted  BIB [facility] from home due to feeling suicidal and mild abdominal pain. States abdominal pain is generalized and started today; states SI has been going on " awhile". Denies hx of SI attempts; and initially denies any plan of how to hurt herself currently--Pt then admits to telling EMS that she had thoughts of jumping off balcony of apartment that she lives in. Denies HI. Admits to drinking " a lot today". Tearful on assessment.Marland Kitchen  HPI:  During evaluation Fergie Dushaj is alert/oriented x 4; calm/cooperative; and mood is congruent with affect.  She called the ambulance with  stomach complaints, but when questioned about SI she said she would jump of a balcony.  However, she denies being suicidal and does not have a history of prior suicide attempts.  She presents to the er on several occasions for medical complications related to alcohol intoxication. She does not appear to be responding to internal/external stimuli or delusional thoughts.  Patient denies suicidal/self-harm/homicidal ideation, psychosis, and paranoia.  Patient answered question appropriately.     Past Psychiatric History: Alcohol abuse  Risk to Self:   Risk to Others:   Prior Inpatient Therapy:   Prior Outpatient Therapy:    Past Medical History:  Past Medical History:  Diagnosis Date   Alcohol abuse    multiple episode gastritis, episode intoxication with delerium June '22   Asthma 30 years old    Past Surgical History:  Procedure Laterality Date   LEG SURGERY Left 2010    TONSILLECTOMY     Family History:  Family History  Problem Relation Age of Onset   Cancer Mother    Alcoholism Father    Diabetes Maternal Grandmother    COPD Paternal Uncle    Family Psychiatric  History: unknown Social History:  Social History   Substance and Sexual Activity  Alcohol Use Yes     Social History   Substance and Sexual Activity  Drug Use No    Social History   Socioeconomic History   Marital status: Single    Spouse name: Not on file   Number of children: Not on file   Years of education: Not on file   Highest education level: Not on file  Occupational History   Not on file  Tobacco Use   Smoking status: Some Days    Types: Cigarettes   Smokeless tobacco: Never  Substance and Sexual Activity   Alcohol use: Yes   Drug use: No   Sexual activity: Yes  Other Topics Concern   Not on file  Social History Narrative   Not on file   Social Determinants of Health   Financial Resource Strain: Not on file  Food Insecurity: Not on file  Transportation Needs: Not on file  Physical Activity: Not on file  Stress: Not on file  Social Connections: Not on file   Additional Social History:    Allergies:  No Known Allergies  Labs:  Results for orders placed or performed during the hospital encounter of 03/16/21 (from the past 48 hour(s))  CBC with Differential     Status: Abnormal  Collection Time: 03/16/21  7:32 PM  Result Value Ref Range   WBC 4.9 4.0 - 10.5 K/uL   RBC 5.08 3.87 - 5.11 MIL/uL   Hemoglobin 12.5 12.0 - 15.0 g/dL   HCT 37.4 36.0 - 46.0 %   MCV 73.6 (L) 80.0 - 100.0 fL   MCH 24.6 (L) 26.0 - 34.0 pg   MCHC 33.4 30.0 - 36.0 g/dL   RDW 17.1 (H) 11.5 - 15.5 %   Platelets 395 150 - 400 K/uL   nRBC 0.0 0.0 - 0.2 %   Neutrophils Relative % 43 %   Neutro Abs 2.1 1.7 - 7.7 K/uL   Lymphocytes Relative 48 %   Lymphs Abs 2.3 0.7 - 4.0 K/uL   Monocytes Relative 6 %   Monocytes Absolute 0.3 0.1 - 1.0 K/uL   Eosinophils Relative 2 %    Eosinophils Absolute 0.1 0.0 - 0.5 K/uL   Basophils Relative 1 %   Basophils Absolute 0.0 0.0 - 0.1 K/uL   Immature Granulocytes 0 %   Abs Immature Granulocytes 0.01 0.00 - 0.07 K/uL    Comment: Performed at [facility], [address] [place], [place] [address]  Comprehensive metabolic panel     Status: Abnormal   Collection Time: 03/16/21  7:32 PM  Result Value Ref Range   Sodium 143 135 - 145 mmol/L   Potassium 3.3 (L) 3.5 - 5.1 mmol/L   Chloride 109 98 - 111 mmol/L   CO2 27 22 - 32 mmol/L   Glucose, Bld 114 (H) 70 - 99 mg/dL    Comment: Glucose reference range applies only to samples taken after fasting for at least 8 hours.   BUN 7 6 - 20 mg/dL   Creatinine, Ser 0.56 0.44 - 1.00 mg/dL   Calcium 8.8 (L) 8.9 - 10.3 mg/dL   Total Protein 7.8 6.5 - 8.1 g/dL   Albumin 4.2 3.5 - 5.0 g/dL   AST 26 15 - 41 U/L   ALT 20 0 - 44 U/L   Alkaline Phosphatase 63 38 - 126 U/L   Total Bilirubin 0.8 0.3 - 1.2 mg/dL   GFR, Estimated >60 >60 mL/min    Comment: (NOTE) Calculated using the CKD-EPI Creatinine Equation (2021)    Anion gap 7 5 - 15    Comment: Performed at [facility], [address] [place], [place] [address]  Ethanol     Status: Abnormal   Collection Time: 03/16/21  7:32 PM  Result Value Ref Range   Alcohol, Ethyl (B) 397 (HH) <10 mg/dL    Comment: CRITICAL RESULT CALLED TO, READ BACK BY AND VERIFIED WITH KIM SUMMERS RN AT 2130 03/16/2021 GAA (NOTE) Lowest detectable limit for serum alcohol is 10 mg/dL.  For medical purposes only. Performed at [facility], [address] [place], [place] [id number]   Salicylate level     Status: Abnormal   Collection Time: 03/16/21  7:32 PM  Result Value Ref Range   Salicylate Lvl [id number] (L) 7.0 - 30.0 mg/dL    Comment: Performed at [facility], [address] [place], [place] [address]  Acetaminophen level     Status: Abnormal   Collection Time: 03/16/21  7:32 PM  Result Value Ref Range    Acetaminophen (Tylenol), Serum <10 (L) 10 - 30 ug/mL    Comment: (NOTE) Therapeutic concentrations vary significantly. A range of 10-30 ug/mL  may be an effective concentration for many patients. However, some  are best treated at  concentrations outside of this range. Acetaminophen concentrations >150 ug/mL at 4 hours after ingestion  and >50 ug/mL at 12 hours after ingestion are often associated with  toxic reactions.  Performed at [facility], [address] [place], [place] [address]   Resp Panel by RT-PCR (Flu A&B, Covid) Nasopharyngeal Swab     Status: None   Collection Time: 03/16/21  7:41 PM   Specimen: Nasopharyngeal Swab; Nasopharyngeal(NP) swabs in vial transport medium  Result Value Ref Range   SARS Coronavirus 2 by RT PCR NEGATIVE NEGATIVE    Comment: (NOTE) SARS-CoV-2 target nucleic acids are NOT DETECTED.  The SARS-CoV-2 RNA is generally detectable in upper respiratory specimens during the acute phase of infection. The lowest concentration of SARS-CoV-2 viral copies this assay can detect is 138 copies/mL. A negative result does not preclude SARS-Cov-2 infection and should not be used as the sole basis for treatment or other patient management decisions. A negative result may occur with  improper specimen collection/handling, submission of specimen other than nasopharyngeal swab, presence of viral mutation(s) within the areas targeted by this assay, and inadequate number of viral copies(<138 copies/mL). A negative result must be combined with clinical observations, patient history, and epidemiological information. The expected result is Negative.  Fact Sheet for Patients:  EntrepreneurPulse.com.au  Fact Sheet for Healthcare Providers:  IncredibleEmployment.be  This test is no t yet approved or cleared by the [place] FDA and  has been authorized for detection and/or diagnosis of SARS-CoV-2 by FDA under an  Emergency Use Authorization (EUA). This EUA will remain  in effect (meaning this test can be used) for the duration of the COVID-19 declaration under Section 564(b)(1) of the Act, 21 U.S.C.section 360bbb-3(b)(1), unless the authorization is terminated  or revoked sooner.       Influenza A by PCR NEGATIVE NEGATIVE   Influenza B by PCR NEGATIVE NEGATIVE    Comment: (NOTE) The Xpert Xpress SARS-CoV-2/FLU/RSV plus assay is intended as an aid in the diagnosis of influenza from Nasopharyngeal swab specimens and should not be used as a sole basis for treatment. Nasal washings and aspirates are unacceptable for Xpert Xpress SARS-CoV-2/FLU/RSV testing.  Fact Sheet for Patients: EntrepreneurPulse.com.au  Fact Sheet for Healthcare Providers: IncredibleEmployment.be  This test is not yet approved or cleared by the [place] FDA and has been authorized for detection and/or diagnosis of SARS-CoV-2 by FDA under an Emergency Use Authorization (EUA). This EUA will remain in effect (meaning this test can be used) for the duration of the COVID-19 declaration under Section 564(b)(1) of the Act, 21 U.S.C. section 360bbb-3(b)(1), unless the authorization is terminated or revoked.  Performed at [facility], [address] [place], [place] [address]     No current facility-administered medications for this encounter.   Current Outpatient Medications  Medication Sig Dispense Refill   albuterol (PROVENTIL) (2.5 MG/3ML) 0.083% nebulizer solution TAKE 3 MLS BY NEBULIZATION EVERY 6 HOURS AS NEEDED FOR WHEEZING OR SHORTNESS OF BREATH. 75 mL 11   albuterol (VENTOLIN HFA) 108 (90 Base) MCG/ACT inhaler TAKE 2 PUFFS BY MOUTH EVERY 6 HOURS AS NEEDED FOR WHEEZE OR SHORTNESS OF BREATH 6.7 g 1   cetirizine (ZYRTEC) 10 MG tablet Take 10 mg by mouth daily.     famotidine (PEPCID) 20 MG tablet Take 1 tablet (20 mg total) by mouth every evening. 90 tablet 3     Musculoskeletal: Strength & Muscle Tone: within normal limits Gait & Station: normal Patient leans: N/A  Psychiatric Specialty Exam:  Presentation  General Appearance: Appropriate for Environment  Eye Contact:Fair  Speech:Clear and Coherent  Speech Volume:Normal  Handedness:Right   Mood and Affect  Mood:Euthymic  Affect:Appropriate   Thought Process  Thought Processes:Coherent  Descriptions of Associations:Intact  Orientation:Full (Time, Place and Person)  Thought Content:WDL  History of Schizophrenia/Schizoaffective disorder:No Duration of Psychotic Symptoms:No data recorded Hallucinations:Hallucinations: None  Ideas of Reference:None  Suicidal Thoughts:Suicidal Thoughts: No  Homicidal Thoughts:Homicidal Thoughts: No   Sensorium  Memory:Immediate Good; Recent Fair  Judgment:Fair  Insight:Fair   Executive Functions  Concentration:Fair  Attention Span:Fair  Recall:Fair  Fund of Knowledge:Fair  Language:Fair   Psychomotor Activity  Psychomotor Activity:Psychomotor Activity: Normal   Assets  Assets:Financial Resources/Insurance; Social Support; Housing; Intimacy   Sleep  Sleep:Sleep: Fair   Physical Exam: Physical Exam Vitals and nursing note reviewed.  Constitutional:      Appearance: Normal appearance. She is normal weight.  HENT:     Head: Normocephalic and atraumatic.     Nose: Nose normal.     Mouth/Throat:     Mouth: Mucous membranes are dry.  Eyes:     Pupils: Pupils are equal, round, and reactive to light.  Pulmonary:     Effort: Pulmonary effort is normal.  Musculoskeletal:     Cervical back: Normal range of motion.  Skin:    General: Skin is warm.  Neurological:     General: No focal deficit present.     Mental Status: She is alert and oriented to person, place, and time.  Psychiatric:        Attention and Perception: Attention and perception normal.        Mood and Affect: Mood normal.        Speech:  Speech normal.        Behavior: Behavior normal. Behavior is cooperative.        Thought Content: Thought content normal.        Cognition and Memory: Cognition and memory normal.        Judgment: Judgment normal.   Review of Systems  Psychiatric/Behavioral:  Positive for substance abuse. Negative for suicidal ideas. The patient is not nervous/anxious.   All other systems reviewed and are negative. Blood pressure 121/85, pulse 98, temperature 98.4 F (36.9 C), temperature source Oral, resp. rate 18, height 5\' 4"  (1.626 m), weight 94 kg, last menstrual period 03/14/2021, SpO2 98 %. Body mass index is 35.57 kg/m.   Disposition: No evidence of imminent risk to self or others at present.   Patient does not meet criteria for psychiatric inpatient admission. Discussed crisis plan, support from social network, calling 911, coming to the Emergency Department, and calling Suicide Hotline.  03/16/2021, NP 03/17/2021 1:18 AM

## 2021-05-05 ENCOUNTER — Ambulatory Visit: Payer: Self-pay | Admitting: Family Medicine

## 2021-05-19 ENCOUNTER — Telehealth: Payer: Self-pay

## 2021-05-19 ENCOUNTER — Ambulatory Visit: Payer: Medicaid Other | Admitting: Family Medicine

## 2021-05-19 NOTE — Telephone Encounter (Signed)
Left voice mail to reschedule patient missed appointment with dr Yetta Barre .

## 2021-07-22 ENCOUNTER — Telehealth: Payer: Self-pay | Admitting: Family Medicine

## 2021-07-22 ENCOUNTER — Telehealth: Payer: Self-pay

## 2021-07-22 NOTE — Telephone Encounter (Signed)
Copied from CRM [id number]. Topic: General - Inquiry ?>> Jul 22, 2021  2:57 PM Elliot Gault wrote: ?Patient would like PCP to recommend a gyn in the [place], [place] or [place]  location ?

## 2021-07-22 NOTE — Telephone Encounter (Signed)
Pt wanted GYN name- I gave her Dr. Christeen Douglas at [facility] ?

## 2021-07-28 ENCOUNTER — Ambulatory Visit: Payer: Medicaid Other | Admitting: Family Medicine

## 2021-11-04 IMAGING — US US ABDOMEN LIMITED
1 series · 14 of 25 positions shown · non-contrast
Comparison: None.

CLINICAL DATA: Abdominal pain

EXAM:
ULTRASOUND ABDOMEN LIMITED RIGHT UPPER QUADRANT

[Series 1: us abdomen limited ruq (liver/gb) · 14 of 54 slices shown]
[im 1/54]
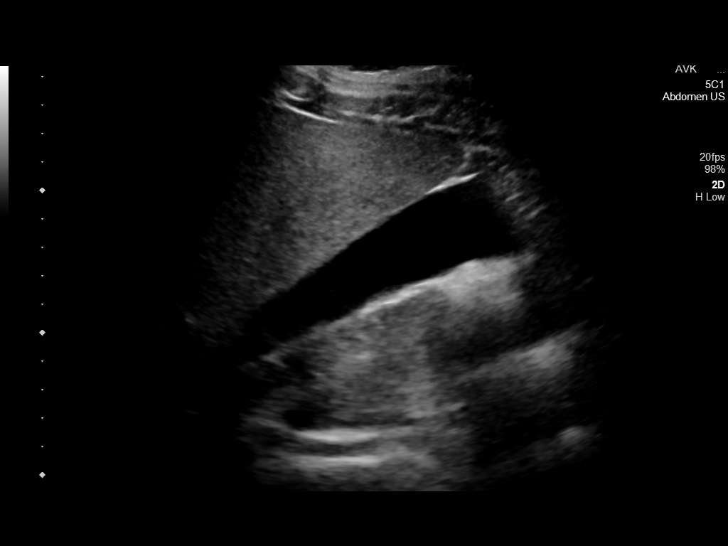
[im 5/54]
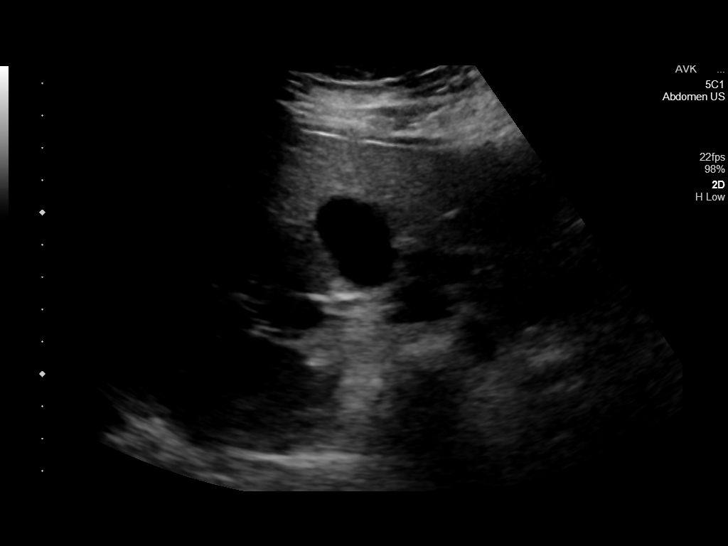
[im 9/54]
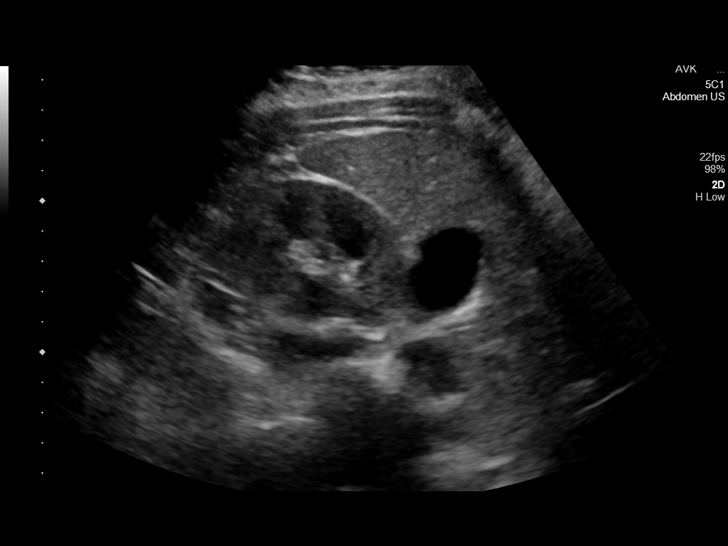
[im 14/54]
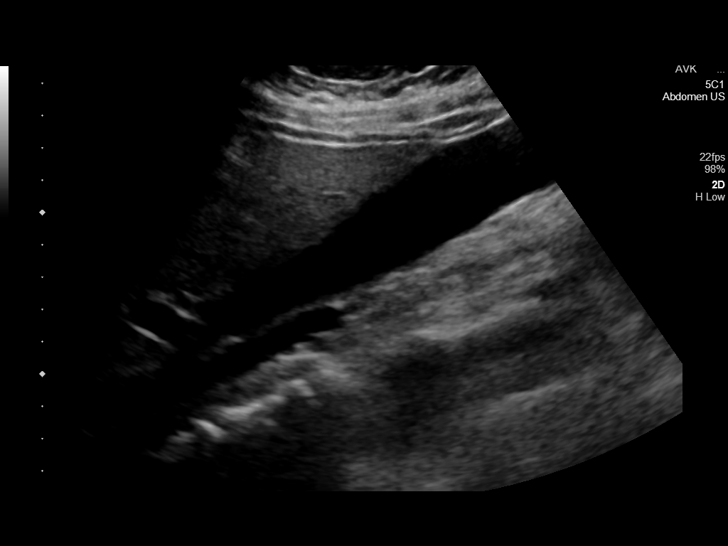
[im 18/54]
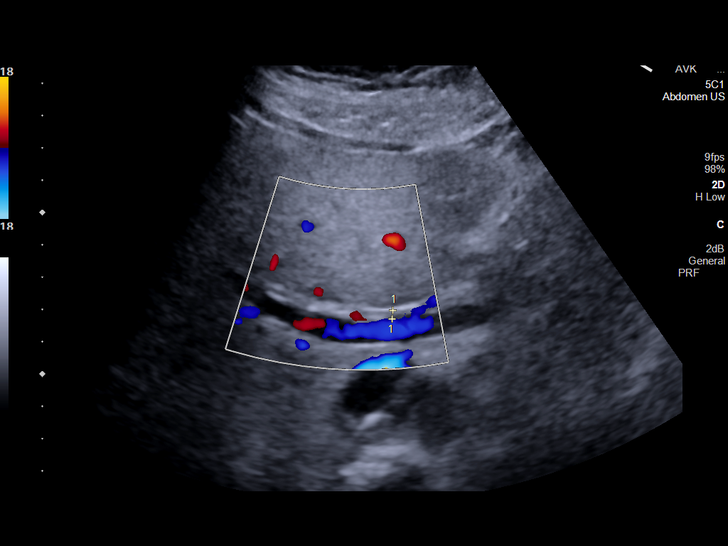
[im 20/54]
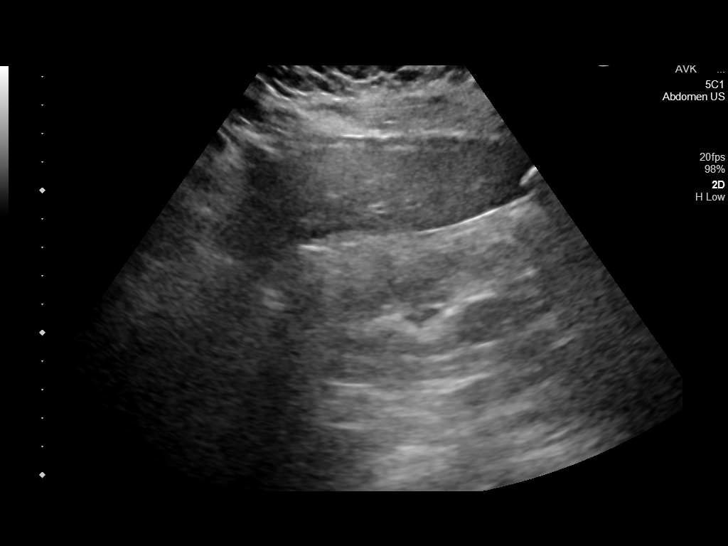
[im 25/54]
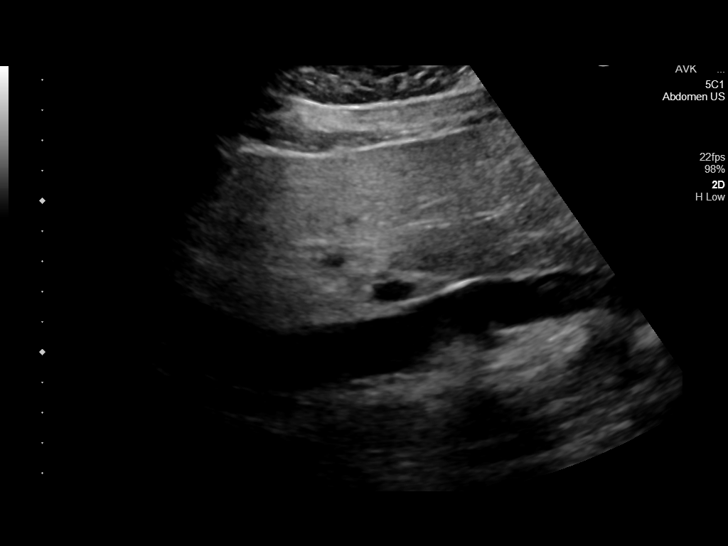
[im 29/54]
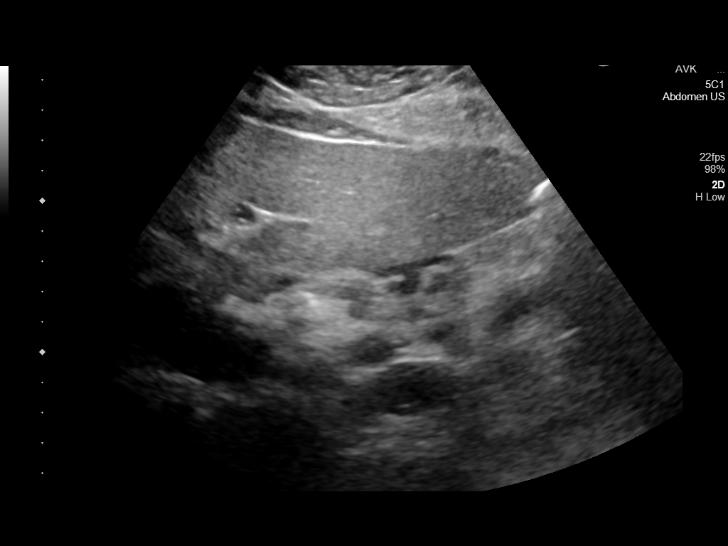
[im 34/54]
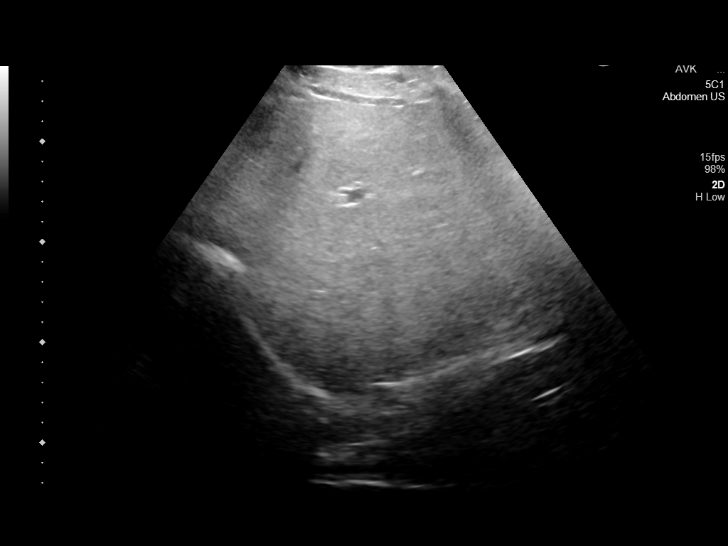
[im 36/54]
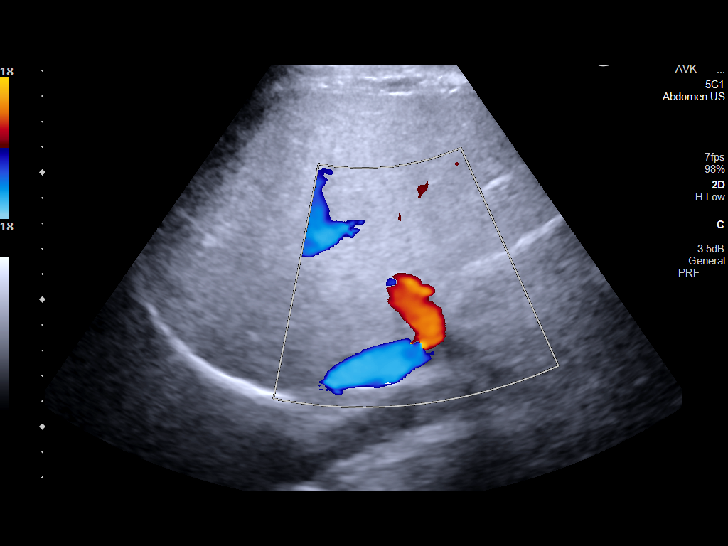
[im 40/54]
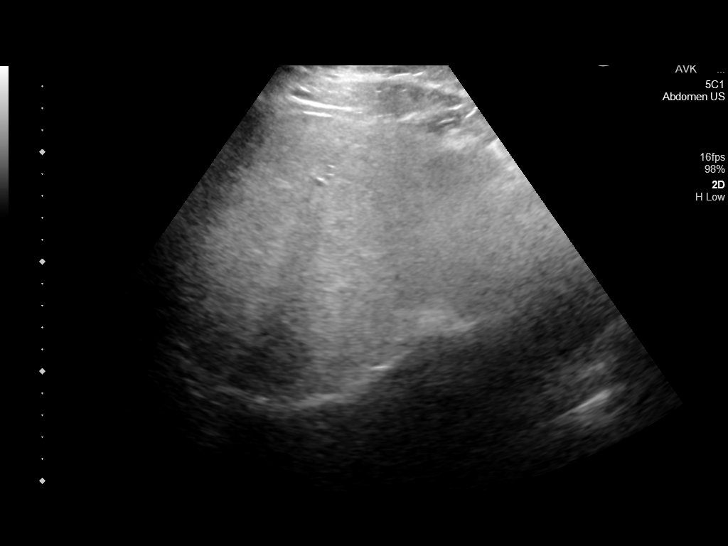
[im 45/54]
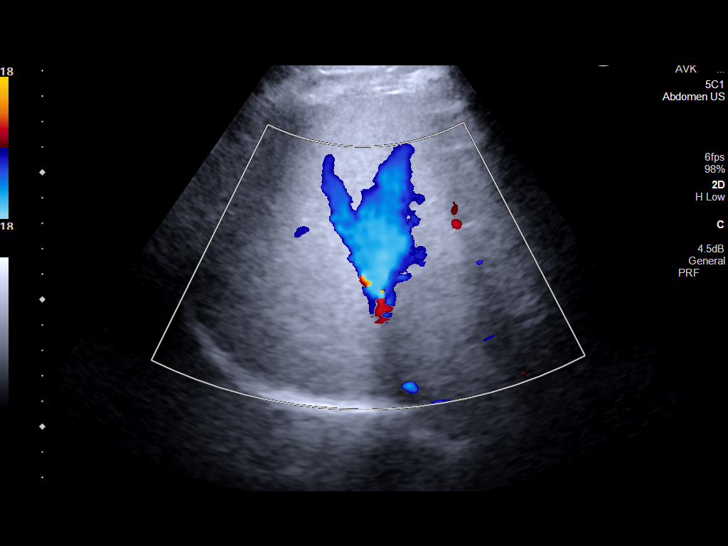
[im 49/54]
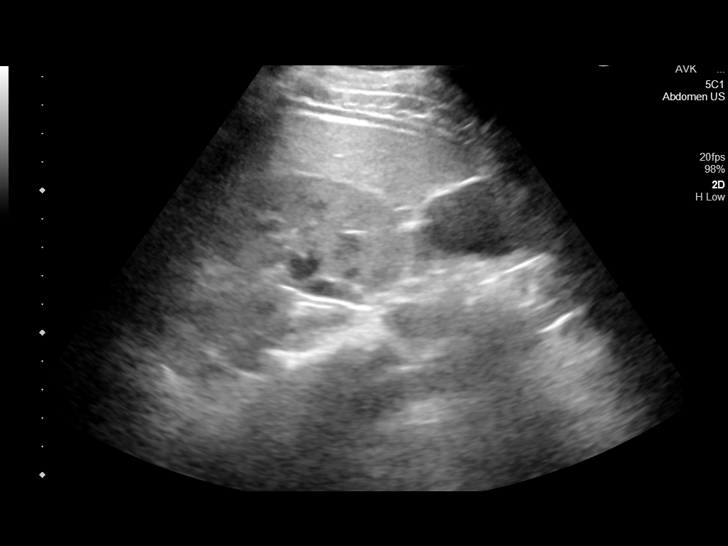
[im 54/54]
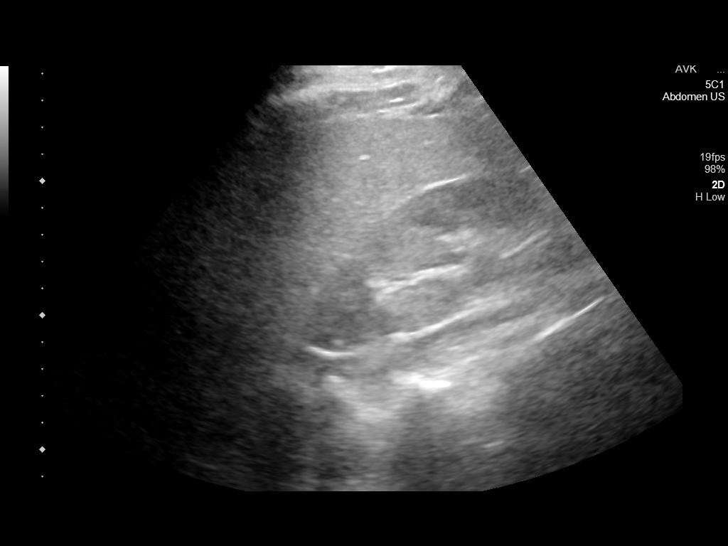

[14 of 25 positions shown; findings below may reference images not displayed]

FINDINGS: Gallbladder:

No gallstones or wall thickening visualized. No sonographic Murphy
sign noted by sonographer.

Common bile duct:

Diameter: 3 mm

Liver:

Echogenic liver with diminished acoustic penetration. No focal
lesion. Portal vein is patent on color Doppler imaging with normal
direction of blood flow towards the liver.

Other: Borderline increased cortical echogenicity of the visualized
right kidney with prominent corticomedullary differentiation.
IMPRESSION: 1. Negative gallbladder.
2. Hepatic steatosis.
